# Patient Record
Sex: Female | Born: 1950 | Race: White | Hispanic: No | Marital: Married | Smoking: Never smoker
Health system: Southern US, Community
[De-identification: ages and names within clinical notes are randomized; demographics above are authoritative.]

## PROBLEM LIST (undated history)

## (undated) DIAGNOSIS — E119 Type 2 diabetes mellitus without complications: Secondary | ICD-10-CM

## (undated) DIAGNOSIS — T8859XA Other complications of anesthesia, initial encounter: Secondary | ICD-10-CM

## (undated) DIAGNOSIS — G629 Polyneuropathy, unspecified: Secondary | ICD-10-CM

## (undated) DIAGNOSIS — R55 Syncope and collapse: Secondary | ICD-10-CM

## (undated) DIAGNOSIS — Z8489 Family history of other specified conditions: Secondary | ICD-10-CM

## (undated) DIAGNOSIS — D869 Sarcoidosis, unspecified: Secondary | ICD-10-CM

## (undated) DIAGNOSIS — I2510 Atherosclerotic heart disease of native coronary artery without angina pectoris: Secondary | ICD-10-CM

## (undated) HISTORY — PX: COLONOSCOPY: SHX174

## (undated) HISTORY — PX: CATARACT EXTRACTION: SUR2

## (undated) HISTORY — PX: CARDIAC CATHETERIZATION: SHX172

## (undated) HISTORY — DX: Sarcoidosis, unspecified: D86.9

---

## 1996-12-18 HISTORY — PX: HYSTERECTOMY ABDOMINAL WITH SALPINGECTOMY: SHX6725

## 2008-12-18 HISTORY — PX: SHOULDER ARTHROSCOPY W/ ROTATOR CUFF REPAIR: SHX2400

## 2010-07-09 ENCOUNTER — Emergency Department (HOSPITAL_COMMUNITY): Admission: EM | Admit: 2010-07-09 | Discharge: 2010-07-10 | Payer: Self-pay | Admitting: Emergency Medicine

## 2011-03-04 LAB — DIFFERENTIAL
Basophils Relative: 1 % (ref 0–1)
Eosinophils Absolute: 0.2 10*3/uL (ref 0.0–0.7)
Lymphocytes Relative: 21 % (ref 12–46)
Lymphs Abs: 1.5 10*3/uL (ref 0.7–4.0)
Neutro Abs: 4.9 10*3/uL (ref 1.7–7.7)
Neutrophils Relative %: 69 % (ref 43–77)

## 2011-03-04 LAB — COMPREHENSIVE METABOLIC PANEL
Chloride: 103 mEq/L (ref 96–112)
GFR calc Af Amer: >60 mL/min (ref >60)
GFR calc non Af Amer: >60 mL/min (ref >60)
Potassium: 4.3 mEq/L (ref 3.5–5.1)
Total Bilirubin: 0.5 mg/dL (ref 0.3–1.2)

## 2011-03-04 LAB — URINALYSIS, ROUTINE W REFLEX MICROSCOPIC
Hgb urine dipstick: NEGATIVE
Nitrite: NEGATIVE
Specific Gravity, Urine: 1.022 (ref 1.005–1.030)
Urobilinogen, UA: 0.2 mg/dL (ref 0.0–1.0)

## 2011-03-04 LAB — POCT CARDIAC MARKERS
CKMB, poc: 1.5 ng/mL (ref 1.0–8.0)
Myoglobin, poc: 76 ng/mL (ref 12–200)
Troponin i, poc: <0.05 ng/mL (ref 0.00–0.09)
Troponin i, poc: <0.05 ng/mL (ref 0.00–0.09)

## 2011-03-04 LAB — CBC
HCT: 36.3 % (ref 36.0–46.0)
Hemoglobin: 12.4 g/dL (ref 12.0–15.0)
MCH: 30.6 pg (ref 26.0–34.0)
MCHC: 34.3 g/dL (ref 30.0–36.0)
Platelets: 231 10*3/uL (ref 150–400)
RDW: 14.7 % (ref 11.5–15.5)

## 2011-03-04 LAB — LIPASE, BLOOD: Lipase: 83 U/L — ABNORMAL HIGH (ref 11–59)

## 2011-03-04 LAB — URINE MICROSCOPIC-ADD ON

## 2014-05-01 ENCOUNTER — Ambulatory Visit: Payer: Self-pay | Admitting: Podiatrist

## 2014-10-16 ENCOUNTER — Other Ambulatory Visit: Payer: Self-pay | Admitting: Physician Assistant

## 2014-10-16 DIAGNOSIS — Z1231 Encounter for screening mammogram for malignant neoplasm of breast: Secondary | ICD-10-CM

## 2014-10-30 ENCOUNTER — Ambulatory Visit: Payer: Self-pay

## 2014-11-17 ENCOUNTER — Ambulatory Visit: Payer: Self-pay

## 2015-01-22 ENCOUNTER — Other Ambulatory Visit: Payer: Self-pay | Admitting: Physician Assistant

## 2015-01-22 DIAGNOSIS — Z780 Asymptomatic menopausal state: Secondary | ICD-10-CM

## 2015-05-24 ENCOUNTER — Other Ambulatory Visit: Payer: Self-pay | Admitting: Physician Assistant

## 2015-05-24 DIAGNOSIS — Z780 Asymptomatic menopausal state: Secondary | ICD-10-CM

## 2015-05-24 DIAGNOSIS — Z1231 Encounter for screening mammogram for malignant neoplasm of breast: Secondary | ICD-10-CM

## 2015-06-17 ENCOUNTER — Other Ambulatory Visit: Payer: Self-pay

## 2015-06-17 ENCOUNTER — Ambulatory Visit: Payer: Self-pay

## 2015-06-29 ENCOUNTER — Ambulatory Visit
Admission: RE | Admit: 2015-06-29 | Discharge: 2015-06-29 | Disposition: A | Discharge status: Home / Self Care | Payer: 59 | Source: Ambulatory Visit | Attending: Physician Assistant | Admitting: Physician Assistant

## 2015-06-29 DIAGNOSIS — Z1231 Encounter for screening mammogram for malignant neoplasm of breast: Secondary | ICD-10-CM

## 2015-06-29 DIAGNOSIS — Z780 Asymptomatic menopausal state: Secondary | ICD-10-CM

## 2015-07-05 ENCOUNTER — Other Ambulatory Visit: Payer: Self-pay | Admitting: Physician Assistant

## 2015-07-05 DIAGNOSIS — R928 Other abnormal and inconclusive findings on diagnostic imaging of breast: Secondary | ICD-10-CM

## 2015-07-09 ENCOUNTER — Ambulatory Visit
Admission: RE | Admit: 2015-07-09 | Discharge: 2015-07-09 | Disposition: A | Discharge status: Home / Self Care | Payer: 59 | Source: Ambulatory Visit | Attending: Physician Assistant | Admitting: Physician Assistant

## 2015-07-09 DIAGNOSIS — R928 Other abnormal and inconclusive findings on diagnostic imaging of breast: Secondary | ICD-10-CM

## 2016-02-01 ENCOUNTER — Emergency Department (HOSPITAL_COMMUNITY): Payer: Self-pay

## 2016-02-01 ENCOUNTER — Inpatient Hospital Stay (HOSPITAL_COMMUNITY)
Admission: EM | Admit: 2016-02-01 | Discharge: 2016-02-05 | DRG: 871 | Disposition: A | Discharge status: Home / Self Care | Payer: Self-pay | Attending: Internal Medicine | Admitting: Internal Medicine

## 2016-02-01 ENCOUNTER — Encounter (HOSPITAL_COMMUNITY): Payer: Self-pay | Admitting: Emergency Medicine

## 2016-02-01 DIAGNOSIS — B9789 Other viral agents as the cause of diseases classified elsewhere: Secondary | ICD-10-CM | POA: Diagnosis present

## 2016-02-01 DIAGNOSIS — A419 Sepsis, unspecified organism: Principal | ICD-10-CM | POA: Diagnosis present

## 2016-02-01 DIAGNOSIS — Z7982 Long term (current) use of aspirin: Secondary | ICD-10-CM

## 2016-02-01 DIAGNOSIS — E876 Hypokalemia: Secondary | ICD-10-CM | POA: Diagnosis not present

## 2016-02-01 DIAGNOSIS — Z6834 Body mass index (BMI) 34.0-34.9, adult: Secondary | ICD-10-CM

## 2016-02-01 DIAGNOSIS — I959 Hypotension, unspecified: Secondary | ICD-10-CM | POA: Diagnosis present

## 2016-02-01 DIAGNOSIS — E669 Obesity, unspecified: Secondary | ICD-10-CM | POA: Diagnosis present

## 2016-02-01 DIAGNOSIS — E119 Type 2 diabetes mellitus without complications: Secondary | ICD-10-CM

## 2016-02-01 DIAGNOSIS — M79662 Pain in left lower leg: Secondary | ICD-10-CM

## 2016-02-01 DIAGNOSIS — E1100 Type 2 diabetes mellitus with hyperosmolarity without nonketotic hyperglycemic-hyperosmolar coma (NKHHC): Secondary | ICD-10-CM | POA: Diagnosis present

## 2016-02-01 DIAGNOSIS — E785 Hyperlipidemia, unspecified: Secondary | ICD-10-CM | POA: Diagnosis present

## 2016-02-01 DIAGNOSIS — R000 Tachycardia, unspecified: Secondary | ICD-10-CM | POA: Diagnosis present

## 2016-02-01 DIAGNOSIS — R51 Headache: Secondary | ICD-10-CM | POA: Diagnosis present

## 2016-02-01 DIAGNOSIS — M7989 Other specified soft tissue disorders: Secondary | ICD-10-CM

## 2016-02-01 DIAGNOSIS — R6521 Severe sepsis with septic shock: Secondary | ICD-10-CM | POA: Diagnosis present

## 2016-02-01 DIAGNOSIS — I10 Essential (primary) hypertension: Secondary | ICD-10-CM | POA: Diagnosis present

## 2016-02-01 DIAGNOSIS — I82402 Acute embolism and thrombosis of unspecified deep veins of left lower extremity: Secondary | ICD-10-CM

## 2016-02-01 DIAGNOSIS — E1165 Type 2 diabetes mellitus with hyperglycemia: Secondary | ICD-10-CM | POA: Diagnosis present

## 2016-02-01 DIAGNOSIS — L03116 Cellulitis of left lower limb: Secondary | ICD-10-CM | POA: Diagnosis present

## 2016-02-01 HISTORY — DX: Type 2 diabetes mellitus without complications: E11.9

## 2016-02-01 LAB — COMPREHENSIVE METABOLIC PANEL
ALT: 29 U/L (ref 14–54)
ANION GAP: 13 (ref 5–15)
AST: 24 U/L (ref 15–41)
Albumin: 4 g/dL (ref 3.5–5.0)
Alkaline Phosphatase: 69 U/L (ref 38–126)
BUN: 19 mg/dL (ref 6–20)
CHLORIDE: 103 mmol/L (ref 101–111)
CO2: 21 mmol/L — AB (ref 22–32)
CREATININE: 0.77 mg/dL (ref 0.44–1.00)
Calcium: 9.4 mg/dL (ref 8.9–10.3)
Glucose, Bld: 151 mg/dL — ABNORMAL HIGH (ref 65–99)
POTASSIUM: 4 mmol/L (ref 3.5–5.1)
SODIUM: 137 mmol/L (ref 135–145)
Total Bilirubin: 0.8 mg/dL (ref 0.3–1.2)
Total Protein: 6.8 g/dL (ref 6.5–8.1)

## 2016-02-01 LAB — CBC WITH DIFFERENTIAL/PLATELET
BASOS ABS: 0 10*3/uL (ref 0.0–0.1)
Basophils Relative: 0 %
Eosinophils Absolute: 0.2 10*3/uL (ref 0.0–0.7)
Eosinophils Relative: 1 %
HEMATOCRIT: 39.6 % (ref 36.0–46.0)
HEMOGLOBIN: 13.4 g/dL (ref 12.0–15.0)
LYMPHS ABS: 0.9 10*3/uL (ref 0.7–4.0)
LYMPHS PCT: 6 %
MCH: 29.2 pg (ref 26.0–34.0)
MCHC: 33.8 g/dL (ref 30.0–36.0)
MCV: 86.3 fL (ref 78.0–100.0)
Monocytes Absolute: 0.8 10*3/uL (ref 0.1–1.0)
Monocytes Relative: 5 %
NEUTROS ABS: 12.7 10*3/uL — AB (ref 1.7–7.7)
NEUTROS PCT: 88 %
PLATELETS: 199 10*3/uL (ref 150–400)
RBC: 4.59 MIL/uL (ref 3.87–5.11)
RDW: 13.4 % (ref 11.5–15.5)
WBC: 14.7 10*3/uL — AB (ref 4.0–10.5)

## 2016-02-01 LAB — I-STAT CG4 LACTIC ACID, ED
LACTIC ACID, VENOUS: 1.87 mmol/L (ref 0.5–2.0)
Lactic Acid, Venous: 1.21 mmol/L (ref 0.5–2.0)

## 2016-02-01 LAB — CBG MONITORING, ED: GLUCOSE-CAPILLARY: 145 mg/dL — AB (ref 65–99)

## 2016-02-01 MED ORDER — VANCOMYCIN HCL IN DEXTROSE 1-5 GM/200ML-% IV SOLN
1000.0000 mg | Freq: Two times a day (BID) | INTRAVENOUS | Status: DC
  Administered 2016-02-02 – 2016-02-04 (×5): 1000 mg via INTRAVENOUS
  Filled 2016-02-01 (×7): qty 200

## 2016-02-01 MED ORDER — VANCOMYCIN HCL 10 G IV SOLR
1500.0000 mg | Freq: Once | INTRAVENOUS | Status: AC
  Administered 2016-02-01: 1500 mg via INTRAVENOUS
  Filled 2016-02-01: qty 1500

## 2016-02-01 MED ORDER — PIPERACILLIN-TAZOBACTAM 3.375 G IVPB
3.3750 g | Freq: Three times a day (TID) | INTRAVENOUS | Status: DC
  Administered 2016-02-02 – 2016-02-04 (×8): 3.375 g via INTRAVENOUS
  Filled 2016-02-01 (×11): qty 50

## 2016-02-01 MED ORDER — SODIUM CHLORIDE 0.9 % IV BOLUS (SEPSIS)
500.0000 mL | INTRAVENOUS | Status: AC
  Administered 2016-02-01: 500 mL via INTRAVENOUS

## 2016-02-01 MED ORDER — OSELTAMIVIR PHOSPHATE 75 MG PO CAPS
75.0000 mg | ORAL_CAPSULE | Freq: Once | ORAL | Status: AC
  Administered 2016-02-01: 75 mg via ORAL
  Filled 2016-02-01: qty 1

## 2016-02-01 MED ORDER — SODIUM CHLORIDE 0.9 % IV BOLUS (SEPSIS)
1000.0000 mL | INTRAVENOUS | Status: AC
  Administered 2016-02-01 (×2): 1000 mL via INTRAVENOUS

## 2016-02-01 MED ORDER — PIPERACILLIN-TAZOBACTAM 3.375 G IVPB 30 MIN
3.3750 g | Freq: Once | INTRAVENOUS | Status: AC
  Administered 2016-02-01: 3.375 g via INTRAVENOUS
  Filled 2016-02-01: qty 50

## 2016-02-01 MED ORDER — SODIUM CHLORIDE 0.9 % IV SOLN
Freq: Once | INTRAVENOUS | Status: AC
  Administered 2016-02-01: via INTRAVENOUS

## 2016-02-01 MED ORDER — ACETAMINOPHEN 325 MG PO TABS
650.0000 mg | ORAL_TABLET | ORAL | Status: DC | PRN
  Administered 2016-02-01 – 2016-02-02 (×2): 650 mg via ORAL
  Filled 2016-02-01: qty 2

## 2016-02-01 MED ORDER — VANCOMYCIN HCL IN DEXTROSE 1-5 GM/200ML-% IV SOLN: 1000.0000 mg | Freq: Once | INTRAVENOUS | Status: DC

## 2016-02-01 NOTE — ED Notes (Signed)
Dr. Gardner at bedside 

## 2016-02-01 NOTE — ED Notes (Signed)
Pt has some redness now present on her left shin, this was not present when the pt first arrived in her room.

## 2016-02-01 NOTE — ED Notes (Signed)
CODE SEPSIS ACTIVATED @ 2024

## 2016-02-01 NOTE — ED Notes (Signed)
Dr. Ranae Palms aware of pts blood pressure.

## 2016-02-01 NOTE — ED Notes (Signed)
Fever and chills since yesterday. Malaise. Tachypneic. tachycardic

## 2016-02-01 NOTE — ED Provider Notes (Signed)
CSN: [id number]     Arrival date & time 02/01/16  1905 History   First MD Initiated Contact with Patient 02/01/16 2036     Chief Complaint  Patient presents with  . Code Sepsis     (Consider location/radiation/quality/duration/timing/severity/associated sxs/prior Treatment) HPI History presents with acute onset fever, malaise, myalgias starting around 5:30 this evening. States she was in her normal state of health prior. Denies cough or difficulty breathing. No chest pain. Patient states she does have a frontal headache but has no focal weakness or numbness. No neck stiffness or pain. Denies vomiting but does have nausea. Has chronic history of vertigo and is experiencing room spinning sensation. Past Medical History  Diagnosis Date  . Diabetes mellitus without complication (HCC)    No past surgical history on file. History reviewed. No pertinent family history. Social History  Substance Use Topics  . Smoking status: None  . Smokeless tobacco: None  . Alcohol Use: None   OB History    No data available     Review of Systems  Constitutional: Positive for chills and fatigue.  HENT: Negative for congestion, sinus pressure and sore throat.   Respiratory: Negative for cough and shortness of breath.   Cardiovascular: Negative for chest pain.  Gastrointestinal: Positive for nausea. Negative for vomiting, abdominal pain and diarrhea.  Genitourinary: Negative for dysuria, frequency and flank pain.  Musculoskeletal: Positive for myalgias. Negative for back pain, neck pain and neck stiffness.  Neurological: Positive for dizziness, weakness (generalized) and headaches. Negative for light-headedness and numbness.  All other systems reviewed and are negative.     Allergies  Vicodin  Home Medications   Prior to Admission medications   Medication Sig Start Date End Date Taking? Authorizing Provider  aspirin 325 MG tablet Take 325 mg by mouth daily.   Yes Historical Provider, MD   canagliflozin (INVOKANA) 300 MG TABS tablet Take 300 mg by mouth daily. 06/25/15  Yes Historical Provider, MD  glipiZIDE (GLUCOTROL XL) 10 MG 24 hr tablet Take 10 mg by mouth daily. 11/25/15 11/24/16 Yes Historical Provider, MD  lisinopril-hydrochlorothiazide (PRINZIDE,ZESTORETIC) 10-12.5 MG tablet Take 1 tablet by mouth daily. 09/09/15  Yes Historical Provider, MD  metFORMIN (GLUCOPHAGE) 1000 MG tablet Take 1,000 mg by mouth 2 (two) times daily. 09/09/15  Yes Historical Provider, MD  Potassium 75 MG TABS Take 75 mg by mouth daily.   Yes Historical Provider, MD  simvastatin (ZOCOR) 40 MG tablet Take 40 mg by mouth at bedtime. 10/13/15  Yes Historical Provider, MD  zolpidem (AMBIEN) 10 MG tablet Take 10 mg by mouth at bedtime as needed.  10/19/15  Yes Historical Provider, MD   BP 93/47 mmHg  Pulse 66  Temp(Src) 98.9 F (37.2 C) (Oral)  Resp 19  Ht 5\' 1"  (1.549 m)  Wt 182 lb 8.7 oz (82.8 kg)  BMI 34.51 kg/m2  SpO2 90% Physical Exam  Constitutional: She is oriented to person, place, and time. She appears well-developed and well-nourished. She appears distressed.  HENT:  Head: Normocephalic and atraumatic.  Mouth/Throat: Oropharynx is clear and moist. No oropharyngeal exudate.  No sinus tenderness with percussion.  Eyes: EOM are normal. Pupils are equal, round, and reactive to light.  Fatigable rotary nystagmus  Neck: Normal range of motion. Neck supple.  No meningismus  Cardiovascular: Regular rhythm.  Exam reveals no gallop and no friction rub.   No murmur heard. Tachycardia  Pulmonary/Chest: Effort normal and breath sounds normal. No respiratory distress. She has no wheezes. She has  no rales. She exhibits no tenderness.  Abdominal: Soft. Bowel sounds are normal. She exhibits no distension and no mass. There is no tenderness. There is no rebound and no guarding.  Musculoskeletal: Normal range of motion. She exhibits no edema or tenderness.  No lower extremity asymmetry. 2+ pitting edema  present. Patient does have left inguinal lymphadenopathy. No cellulitis visualized. No crepitance. CVA tenderness bilaterally.  Lymphadenopathy:    She has no cervical adenopathy.  Neurological: She is alert and oriented to person, place, and time.  Requiring assistance to transfer from wheelchair to bed. Appears to be moving all extremities without focal deficit. Sensation intact. Bilateral finger-to-nose is intact.  Skin: Skin is warm and dry. No rash noted. No erythema.  Psychiatric: She has a normal mood and affect. Her behavior is normal.  Nursing note and vitals reviewed.   ED Course  Procedures (including critical care time) Labs Review Labs Reviewed  COMPREHENSIVE METABOLIC PANEL - Abnormal; Notable for the following:    CO2 21 (*)    Glucose, Bld 151 (*)    All other components within normal limits  CBC WITH DIFFERENTIAL/PLATELET - Abnormal; Notable for the following:    WBC 14.7 (*)    Neutro Abs 12.7 (*)    All other components within normal limits  URINALYSIS, ROUTINE W REFLEX MICROSCOPIC (NOT AT [facility]) - Abnormal; Notable for the following:    APPearance CLOUDY (*)    Glucose, UA >1000 (*)    Hgb urine dipstick TRACE (*)    Ketones, ur 15 (*)    All other components within normal limits  URINE MICROSCOPIC-ADD ON - Abnormal; Notable for the following:    Squamous Epithelial / LPF 0-5 (*)    Crystals CA OXALATE CRYSTALS (*)    All other components within normal limits  CBC - Abnormal; Notable for the following:    WBC 18.0 (*)    Hemoglobin 11.1 (*)    HCT 33.6 (*)    All other components within normal limits  BASIC METABOLIC PANEL - Abnormal; Notable for the following:    CO2 20 (*)    Glucose, Bld 161 (*)    Calcium 7.7 (*)    All other components within normal limits  GLUCOSE, CAPILLARY - Abnormal; Notable for the following:    Glucose-Capillary 121 (*)    All other components within normal limits  GLUCOSE, CAPILLARY - Abnormal; Notable for the following:     Glucose-Capillary 121 (*)    All other components within normal limits  GLUCOSE, CAPILLARY - Abnormal; Notable for the following:    Glucose-Capillary 102 (*)    All other components within normal limits  CBG MONITORING, ED - Abnormal; Notable for the following:    Glucose-Capillary 145 (*)    All other components within normal limits  CBG MONITORING, ED - Abnormal; Notable for the following:    Glucose-Capillary 144 (*)    All other components within normal limits  CULTURE, BLOOD (ROUTINE X 2)  CULTURE, BLOOD (ROUTINE X 2)  MRSA PCR SCREENING  URINE CULTURE  PROCALCITONIN  GLUCOSE, CAPILLARY  GLUCOSE, CAPILLARY  COMPREHENSIVE METABOLIC PANEL  CBC  PROTIME-INR  APTT  I-STAT CG4 LACTIC ACID, ED  I-STAT CG4 LACTIC ACID, ED  I-STAT CG4 LACTIC ACID, ED    Imaging Review Dg Chest 2 View  02/01/2016  CLINICAL DATA:  65 year old female with fever and chills EXAM: CHEST  2 VIEW COMPARISON:  None. FINDINGS: Two views of the chest do not demonstrate a  focal consolidation. There is no pleural effusion or pneumothorax. The lungs are hypovolemic. The cardiac silhouette is within normal limits with the osseous structures appear unremarkable. IMPRESSION: No active cardiopulmonary disease. Electronically Signed   By: Elgie Collard M.D.   On: 02/01/2016 20:47   Ct Tibia Fibula Left W Contrast  02/02/2016  CLINICAL DATA:  Fever and chills since 01/31/2016. Pain, swelling, and redness to the left lower extremity. EXAM: CT OF THE LOWER LEFT EXTREMITY WITH CONTRAST TECHNIQUE: Multidetector CT imaging of the left lower leg was performed according to the standard protocol following intravenous contrast administration. COMPARISON:  None. CONTRAST:  75 mL Omnipaque 300 FINDINGS: Diffuse infiltration throughout the subcutaneous fat of the left lower leg most prominent along the medial surface. This is likely due to edema or cellulitis. No discrete loculated fluid collections are identified suggesting no  discrete abscess. Anterior and posterior muscle compartments appear intact. The left tibia and fibula appear intact. No acute fractures are demonstrated. No bone erosion, cortical thickening, or bone sclerosis. No changes to suggest osteomyelitis. MRI would be more sensitive for evaluation of bone and soft tissue structures if clinically indicated. IMPRESSION: Diffuse edema versus cellulitis throughout the right lower leg medially. No discrete abscess identified. No changes to suggest osteomyelitis. Electronically Signed   By: Burman Nieves M.D.   On: 02/02/2016 06:20   I have personally reviewed and evaluated these images and lab results as part of my medical decision-making.   EKG Interpretation   Date/Time:  Tuesday February 01 2016 21:30:24 EST Ventricular Rate:  107 PR Interval:  150 QRS Duration: 91 QT Interval:  329 QTC Calculation: 439 R Axis:   99 Text Interpretation:  Sinus tachycardia Right axis deviation Low voltage,  precordial leads ED PHYSICIAN INTERPRETATION AVAILABLE IN [facility]  Confirmed by TEST, Record ([id number]) on 02/02/2016 8:03:09 AM      MDM   Final diagnoses:  Sepsis, due to unspecified organism (HCC)  Cellulitis of left lower extremity    Code sepsis called based on vital signs. Patient has a normal lactic acid with elevation in her white blood cell count. Unknown source. Will start on antibiotics and IV fluids  She has developed left lower extremity redness during her stay. Consistent with cellulitis. Has been given IV fluids and broad-spectrum antibiotics. Will discuss with hospitalist and admitted.  Loren Racer, MD 02/02/16 2259

## 2016-02-01 NOTE — Consult Note (Addendum)
Pharmacy Antibiotic Note  Felicia Mills is a 65 y.o. female admitted on 02/01/2016 with sepsis.  Pharmacy has been consulted for vanc/zosyn dosing.   Sepsis due to known source. Pt febrile to 103.3 w/ WBC elevated, tachycardia, and tachypnea.   Plan: Vanc 1500 mg IV x1 Zosyn 3.375 g IV x1 F/u renal function  Height:  (154.9 cm) Weight: 162 lb (73.483 kg) IBW/kg (Calculated) : 47.8  Temp (24hrs), Avg:103.3 F (39.6 C), Min:103.3 F (39.6 C), Max:103.3 F (39.6 C)   Recent Labs Lab 02/01/16 2033 02/01/16 2036  WBC 14.7*  --   LATICACIDVEN  --  1.87    CrCl cannot be calculated (Patient has no serum creatinine result on file.).    Allergies  Allergen Reactions  . Vicodin [Hydrocodone-Acetaminophen] Nausea And Vomiting    Antimicrobials this admission: Vanc 2/14 >>  Zosyn 2/14 >>   Dose adjustments this admission: n/a  Microbiology results: 2/14 BCx:   Thank you for allowing pharmacy to be a part of this patient's care.  Greggory Stallion, PharmD Clinical Pharmacy Resident Pager # [phone] 02/01/2016 9:45 PM    Pharmacy Code Sepsis Protocol  Time of code sepsis page: 20:27  Antibiotics delivered at 21:03  Were antibiotics ordered at the time of the code sepsis page? No Was it required to contact the physician?  Physician not contacted  Physician contacted to order antibiotics for code sepsis  Physician contacted to recommend changing antibiotics  Pharmacy consulted for: vanc/zosyn  Anti-infectives    Start     Dose/Rate Route Frequency Ordered Stop   02/01/16 2100  piperacillin-tazobactam (ZOSYN) IVPB 3.375 g     3.375 g 100 mL/hr over 30 Minutes Intravenous  Once 02/01/16 2056     02/01/16 2100  vancomycin (VANCOCIN) IVPB 1000 mg/200 mL premix  Status:  Discontinued     1,000 mg 200 mL/hr over 60 Minutes Intravenous  Once 02/01/16 2056 02/01/16 2057   02/01/16 2100  oseltamivir (TAMIFLU) capsule 75 mg     75 mg Oral  Once 02/01/16 2057      02/01/16 2100  vancomycin (VANCOCIN) 1,500 mg in sodium chloride 0.9 % 500 mL IVPB     1,500 mg 250 mL/hr over 120 Minutes Intravenous  Once 02/01/16 2057          Nurse education provided:  Minutes left to administer antibiotics to achieve 1 hour goal  Correct order of antibiotic administration  Antibiotic Y-site compatibilities     Annamary Carolin, PharmD 02/01/2016, 9:04 PM  Addendum: Good renal so will continue zosyn 3.375gm IV Q8H (4 hr inf) and vancomycin 1gm IV Q12H  Lysle Pearl, PharmD, BCPS Pager # [phone] 02/01/2016 9:55 PM

## 2016-02-02 ENCOUNTER — Encounter (HOSPITAL_COMMUNITY): Payer: Self-pay

## 2016-02-02 ENCOUNTER — Inpatient Hospital Stay (HOSPITAL_COMMUNITY): Payer: Self-pay

## 2016-02-02 DIAGNOSIS — E119 Type 2 diabetes mellitus without complications: Secondary | ICD-10-CM

## 2016-02-02 DIAGNOSIS — L03119 Cellulitis of unspecified part of limb: Secondary | ICD-10-CM

## 2016-02-02 DIAGNOSIS — L03116 Cellulitis of left lower limb: Secondary | ICD-10-CM | POA: Diagnosis present

## 2016-02-02 DIAGNOSIS — I10 Essential (primary) hypertension: Secondary | ICD-10-CM | POA: Diagnosis present

## 2016-02-02 DIAGNOSIS — L02419 Cutaneous abscess of limb, unspecified: Secondary | ICD-10-CM

## 2016-02-02 DIAGNOSIS — I959 Hypotension, unspecified: Secondary | ICD-10-CM

## 2016-02-02 DIAGNOSIS — A419 Sepsis, unspecified organism: Secondary | ICD-10-CM | POA: Diagnosis present

## 2016-02-02 LAB — URINE MICROSCOPIC-ADD ON: Bacteria, UA: NONE SEEN

## 2016-02-02 LAB — CBC
HEMATOCRIT: 33.6 % — AB (ref 36.0–46.0)
HEMOGLOBIN: 11.1 g/dL — AB (ref 12.0–15.0)
MCH: 28.6 pg (ref 26.0–34.0)
MCHC: 33 g/dL (ref 30.0–36.0)
MCV: 86.6 fL (ref 78.0–100.0)
Platelets: 167 10*3/uL (ref 150–400)
RBC: 3.88 MIL/uL (ref 3.87–5.11)
RDW: 13.6 % (ref 11.5–15.5)
WBC: 18 10*3/uL — ABNORMAL HIGH (ref 4.0–10.5)

## 2016-02-02 LAB — BASIC METABOLIC PANEL
Anion gap: 8 (ref 5–15)
BUN: 15 mg/dL (ref 6–20)
CHLORIDE: 109 mmol/L (ref 101–111)
CO2: 20 mmol/L — AB (ref 22–32)
CREATININE: 0.79 mg/dL (ref 0.44–1.00)
Calcium: 7.7 mg/dL — ABNORMAL LOW (ref 8.9–10.3)
GFR calc Af Amer: >60 mL/min (ref >60)
GFR calc non Af Amer: >60 mL/min (ref >60)
Glucose, Bld: 161 mg/dL — ABNORMAL HIGH (ref 65–99)
Potassium: 3.7 mmol/L (ref 3.5–5.1)
Sodium: 137 mmol/L (ref 135–145)

## 2016-02-02 LAB — GLUCOSE, CAPILLARY
GLUCOSE-CAPILLARY: 121 mg/dL — AB (ref 65–99)
GLUCOSE-CAPILLARY: 99 mg/dL (ref 65–99)
GLUCOSE-CAPILLARY: 84 mg/dL (ref 65–99)
Glucose-Capillary: 121 mg/dL — ABNORMAL HIGH (ref 65–99)
Glucose-Capillary: 102 mg/dL — ABNORMAL HIGH (ref 65–99)

## 2016-02-02 LAB — MRSA PCR SCREENING: MRSA BY PCR: NEGATIVE

## 2016-02-02 LAB — URINALYSIS, ROUTINE W REFLEX MICROSCOPIC
BILIRUBIN URINE: NEGATIVE
Glucose, UA: >1000 mg/dL — AB
KETONES UR: 15 mg/dL — AB
Leukocytes, UA: NEGATIVE
NITRITE: NEGATIVE
PROTEIN: NEGATIVE mg/dL
SPECIFIC GRAVITY, URINE: 1.03 (ref 1.005–1.030)
pH: 5 (ref 5.0–8.0)

## 2016-02-02 LAB — CBG MONITORING, ED: Glucose-Capillary: 144 mg/dL — ABNORMAL HIGH (ref 65–99)

## 2016-02-02 LAB — I-STAT CG4 LACTIC ACID, ED: Lactic Acid, Venous: 0.97 mmol/L (ref 0.5–2.0)

## 2016-02-02 LAB — PROCALCITONIN: PROCALCITONIN: 2.68 ng/mL

## 2016-02-02 MED ORDER — ZOLPIDEM TARTRATE 5 MG PO TABS
5.0000 mg | ORAL_TABLET | Freq: Every evening | ORAL | Status: DC | PRN
  Administered 2016-02-02 – 2016-02-04 (×3): 5 mg via ORAL
  Filled 2016-02-02 (×3): qty 1

## 2016-02-02 MED ORDER — SODIUM CHLORIDE 0.9 % IV SOLN
Freq: Once | INTRAVENOUS | Status: AC
  Administered 2016-02-02: 04:00:00 via INTRAVENOUS

## 2016-02-02 MED ORDER — SODIUM CHLORIDE 0.9 % IV SOLN
INTRAVENOUS | Status: DC
  Administered 2016-02-02 – 2016-02-03 (×4): via INTRAVENOUS

## 2016-02-02 MED ORDER — ACETAMINOPHEN 325 MG PO TABS
650.0000 mg | ORAL_TABLET | Freq: Four times a day (QID) | ORAL | Status: DC | PRN
  Filled 2016-02-02: qty 2

## 2016-02-02 MED ORDER — OXYCODONE HCL 5 MG PO TABS
5.0000 mg | ORAL_TABLET | ORAL | Status: DC | PRN
  Administered 2016-02-02 – 2016-02-04 (×3): 10 mg via ORAL
  Filled 2016-02-02 (×3): qty 2

## 2016-02-02 MED ORDER — TRAMADOL HCL 50 MG PO TABS
50.0000 mg | ORAL_TABLET | Freq: Four times a day (QID) | ORAL | Status: DC | PRN
Start: 2016-02-02 — End: 2016-02-05
  Administered 2016-02-03 (×2): 50 mg via ORAL
  Filled 2016-02-02 (×2): qty 1

## 2016-02-02 MED ORDER — SODIUM CHLORIDE 0.9% FLUSH
3.0000 mL | Freq: Two times a day (BID) | INTRAVENOUS | Status: DC
  Administered 2016-02-02 – 2016-02-03 (×2): 3 mL via INTRAVENOUS

## 2016-02-02 MED ORDER — ASPIRIN 325 MG PO TABS
325.0000 mg | ORAL_TABLET | Freq: Every day | ORAL | Status: DC
  Administered 2016-02-02 – 2016-02-05 (×4): 325 mg via ORAL
  Filled 2016-02-02 (×5): qty 1

## 2016-02-02 MED ORDER — INFLUENZA VAC SPLIT QUAD 0.5 ML IM SUSY
0.5000 mL | PREFILLED_SYRINGE | INTRAMUSCULAR | Status: AC
  Administered 2016-02-03: 0.5 mL via INTRAMUSCULAR
  Filled 2016-02-02: qty 0.5

## 2016-02-02 MED ORDER — ZOLPIDEM TARTRATE 5 MG PO TABS
10.0000 mg | ORAL_TABLET | Freq: Every evening | ORAL | Status: DC | PRN
  Administered 2016-02-02: 10 mg via ORAL
  Filled 2016-02-02: qty 2

## 2016-02-02 MED ORDER — ACETAMINOPHEN 325 MG PO TABS
650.0000 mg | ORAL_TABLET | ORAL | Status: DC | PRN
  Administered 2016-02-02: 650 mg via ORAL
  Filled 2016-02-02: qty 2

## 2016-02-02 MED ORDER — SIMVASTATIN 40 MG PO TABS
40.0000 mg | ORAL_TABLET | Freq: Every day | ORAL | Status: DC
  Administered 2016-02-02 – 2016-02-04 (×4): 40 mg via ORAL
  Filled 2016-02-02 (×5): qty 1

## 2016-02-02 MED ORDER — ENOXAPARIN SODIUM 40 MG/0.4ML ~~LOC~~ SOLN
40.0000 mg | Freq: Every day | SUBCUTANEOUS | Status: DC
  Administered 2016-02-02 – 2016-02-05 (×4): 40 mg via SUBCUTANEOUS
  Filled 2016-02-02 (×5): qty 0.4

## 2016-02-02 MED ORDER — PNEUMOCOCCAL VAC POLYVALENT 25 MCG/0.5ML IJ INJ
0.5000 mL | INJECTION | INTRAMUSCULAR | Status: AC
  Administered 2016-02-03: 0.5 mL via INTRAMUSCULAR
  Filled 2016-02-02: qty 0.5

## 2016-02-02 MED ORDER — ACETAMINOPHEN 325 MG PO TABS: 650.0000 mg | ORAL_TABLET | ORAL | Status: DC | PRN

## 2016-02-02 MED ORDER — INSULIN ASPART 100 UNIT/ML ~~LOC~~ SOLN
0.0000 [IU] | Freq: Three times a day (TID) | SUBCUTANEOUS | Status: DC
  Administered 2016-02-03: 2 [IU] via SUBCUTANEOUS

## 2016-02-02 MED ORDER — SODIUM CHLORIDE 0.9 % IV SOLN
Freq: Once | INTRAVENOUS | Status: AC
  Administered 2016-02-02: 05:00:00 via INTRAVENOUS

## 2016-02-02 MED ORDER — SODIUM CHLORIDE 0.9 % IV SOLN
Freq: Once | INTRAVENOUS | Status: AC
  Administered 2016-02-02: 06:00:00 via INTRAVENOUS

## 2016-02-02 MED ORDER — IOHEXOL 300 MG/ML  SOLN
75.0000 mL | Freq: Once | INTRAMUSCULAR | Status: AC | PRN
Start: 2016-02-02 — End: 2016-02-02
  Administered 2016-02-02: 75 mL via INTRAVENOUS

## 2016-02-02 NOTE — Care Management Note (Signed)
Case Management Note  Patient Details  Name: Felicia Mills MRN: [id number] Date of Birth: Dec 11, 1951  Subjective/Objective:   Pt admitted with sepsis                 Action/Plan:  Pt is independent from home with husband.  CM will continue to monitor for disposition needs   Expected Discharge Date:                  Expected Discharge Plan:  Home/Self Care  In-House Referral:     Discharge planning Services  CM Consult  Post Acute Care Choice:    Choice offered to:     DME Arranged:    DME Agency:     HH Arranged:    HH Agency:     Status of Service:  In process, will continue to follow  Medicare Important Message Given:    Date Medicare IM Given:    Medicare IM give by:    Date Additional Medicare IM Given:    Additional Medicare Important Message give by:     If discussed at Long Length of Stay Meetings, dates discussed:    Additional Comments:  Cherylann Parr, RN 02/02/2016, 2:26 PM

## 2016-02-02 NOTE — Consult Note (Signed)
PULMONARY / CRITICAL CARE MEDICINE   Name: Felicia Mills MRN: [id number] DOB: 02-27-1951    ADMISSION DATE:  02/01/2016 CONSULTATION DATE:  02/01/16  REFERRING MD:  Maretta Bees  CHIEF COMPLAINT:  Septic shock, LE cellulitis  HISTORY OF PRESENT ILLNESS:   Felicia Mills is a 65 year old with history of type 2 diabetes mellitus.she is admitted on 2/14 with left lower extremity cellulitis, septic shock. She is admitted to hospitalist service and given 7 L of IV fluid resuscitation with still low blood pressure. PCCM consulted for help with management of shock.   PAST MEDICAL HISTORY :  She  has a past medical history of Diabetes mellitus without complication (HCC).  PAST SURGICAL HISTORY: She  has no past surgical history on file.  Allergies  Allergen Reactions  . Vicodin [Hydrocodone-Acetaminophen] Nausea And Vomiting    No current facility-administered medications on file prior to encounter.   No current outpatient prescriptions on file prior to encounter.    FAMILY HISTORY:  Her has no family status information on file.   SOCIAL HISTORY: No significant smoking history  REVIEW OF SYSTEMS:   LE swelling, pain, tenderness. Fevers, chills. No shortness of breath, wheezing, cough, sputum production. No fevers, chills, loss of weight, loss of appetite. No nausea, vomiting, diarrhea, constipation. All other review of systems is negative  SUBJECTIVE:    VITAL SIGNS: BP 82/49 mmHg  Pulse 84  Temp(Src) 99.1 F (37.3 C) (Oral)  Resp 17  Ht  (1.549 m)  Wt 182 lb 8.7 oz (82.8 kg)  BMI 34.51 kg/m2  SpO2 95%  HEMODYNAMICS:    VENTILATOR SETTINGS:    INTAKE / OUTPUT: I/O last 3 completed shifts: In: -  Out: 720 [Urine:720]  PHYSICAL EXAMINATION: General:  Awake, oriented, no distress Neuro:  No gross focal deficits HEENT:  Moist mucous membranes, no JVD, thyromegaly Cardiovascular: Regular rate and rhythm, no murmurs rubs gallops Lungs:  Clear, no wheeze,  crackles. Abdomen:  Soft, + BS Skin:  Intact  LABS:  BMET  Recent Labs Lab 02/01/16 2033 02/02/16 0310  NA 137 137  K 4.0 3.7  CL 103 109  CO2 21* 20*  BUN 19 15  CREATININE 0.77 0.79  GLUCOSE 151* 161*    Electrolytes  Recent Labs Lab 02/01/16 2033 02/02/16 0310  CALCIUM 9.4 7.7*    CBC  Recent Labs Lab 02/01/16 2033 02/02/16 0310  WBC 14.7* 18.0*  HGB 13.4 11.1*  HCT 39.6 33.6*  PLT 199 167    Coag's No results for input(s): APTT, INR in the last 168 hours.  Sepsis Markers  Recent Labs Lab 02/01/16 2036 02/01/16 2320 02/02/16 0310 02/02/16 0402  LATICACIDVEN 1.87 1.21  --  0.97  PROCALCITON  --   --  2.68  --     ABG No results for input(s): PHART, PCO2ART, PO2ART in the last 168 hours.  Liver Enzymes  Recent Labs Lab 02/01/16 2033  AST 24  ALT 29  ALKPHOS 69  BILITOT 0.8  ALBUMIN 4.0    Cardiac Enzymes No results for input(s): TROPONINI, PROBNP in the last 168 hours.  Glucose  Recent Labs Lab 02/01/16 2101 02/02/16 0341  GLUCAP 145* 144*    Imaging Dg Chest 2 View  02/01/2016  CLINICAL DATA:  65 year old female with fever and chills EXAM: CHEST  2 VIEW COMPARISON:  None. FINDINGS: Two views of the chest do not demonstrate a focal consolidation. There is no pleural effusion or pneumothorax. The lungs are hypovolemic. The cardiac  silhouette is within normal limits with the osseous structures appear unremarkable. IMPRESSION: No active cardiopulmonary disease. Electronically Signed   By: Elgie Collard M.D.   On: 02/01/2016 20:47   Ct Tibia Fibula Left W Contrast  02/02/2016  CLINICAL DATA:  Fever and chills since 01/31/2016. Pain, swelling, and redness to the left lower extremity. EXAM: CT OF THE LOWER LEFT EXTREMITY WITH CONTRAST TECHNIQUE: Multidetector CT imaging of the left lower leg was performed according to the standard protocol following intravenous contrast administration. COMPARISON:  None. CONTRAST:  75 mL Omnipaque  300 FINDINGS: Diffuse infiltration throughout the subcutaneous fat of the left lower leg most prominent along the medial surface. This is likely due to edema or cellulitis. No discrete loculated fluid collections are identified suggesting no discrete abscess. Anterior and posterior muscle compartments appear intact. The left tibia and fibula appear intact. No acute fractures are demonstrated. No bone erosion, cortical thickening, or bone sclerosis. No changes to suggest osteomyelitis. MRI would be more sensitive for evaluation of bone and soft tissue structures if clinically indicated. IMPRESSION: Diffuse edema versus cellulitis throughout the right lower leg medially. No discrete abscess identified. No changes to suggest osteomyelitis. Electronically Signed   By: Burman Nieves M.D.   On: 02/02/2016 06:20     STUDIES:  CT scan lt LE 2/15 >> Diffuse erythema, cellulitis. No abscess or gas  CULTURES: Bcx X 2 2/14 >> Ucx 2/14 >.  ANTIBIOTICS: Vanco 2/14 >> Zosyn 2/14 >>  SIGNIFICANT EVENTS: 2/14 - Admit with cellutlitis  LINES/TUBES:  DISCUSSION: 65 Y/O with DM admitted with septic shock from Lt LE cellulitis. PCCM consulted for help with management of shock. Her BP appears to be responding now with a MAP of 70. She has normal lactate with good urine output, adequate mentation, good capillary refill. She has no evidence of end organ hypoperfusion and no need for pressors.  Reccomendations: - Continued fluids resuscitation with NS at 150cc/hr - Monitor for progression of cellulitis. Surgery consult if sepsis or leg examination worsens. - Broad spectrun anx. Follow cultures - Consider LE [place] to R/O DVT.  PCCM will sign off. Please call back as needed.  Critical care time- 35 mins.  Chilton Greathouse MD [facility] Pager [phone] If no answer or after 3pm call: [phone] 02/02/2016, 8:09 AM

## 2016-02-02 NOTE — Progress Notes (Signed)
[facility] TEAM 1 - Stepdown/ICU TEAM PROGRESS NOTE  Felicia Mills ZOX:[id number] DOB: 04-30-1951 DOA: 02/01/2016 PCP: No primary care provider on file.  Admit HPI / Brief Narrative: 65 y.o. female who presented to the ED with c/o fever, chills, mild headache, left groin and lower leg pain. Nausea but no vomiting, no diarrhea, no cough, no SOB, no chest pain. Symptom onset was sudden and severely around 5:30 PM the night of presentation. During her stay in the ED the L shin became progressively erythematous. She reported this same presentation in the past with cellulitis of the leg, most recently 3 years prior.   HPI/Subjective: Pt seen for f/u visit.  Assessment/Plan:  Sepsis due to L LE cellulitis  No exam findings or imaging findings presently to suggest fasciitis or abscess   Hypotension w/ hx of HTN  DM2  Code Status: FULL Family Communication: spoke w/ husband at bedside  Disposition Plan: SDU  Consultants: none  Procedures: none  Antibiotics: Zosyn 2/14 > Vancomycin 2/14 >  DVT prophylaxis: lovenox  Objective: Blood pressure 89/52, pulse 83, temperature 99.1 F (37.3 C), temperature source Oral, resp. rate 20, height  (1.549 m), weight 82.8 kg (182 lb 8.7 oz), SpO2 98 %.  Intake/Output Summary (Last 24 hours) at 02/02/16 0809 Last data filed at 02/02/16 0800  Gross per 24 hour  Intake    300 ml  Output   1020 ml  Net   -720 ml   Exam: Pt seen for f/u visit.  Data Reviewed: Basic Metabolic Panel:  Recent Labs Lab 02/01/16 2033 02/02/16 0310  NA 137 137  K 4.0 3.7  CL 103 109  CO2 21* 20*  GLUCOSE 151* 161*  BUN 19 15  CREATININE 0.77 0.79  CALCIUM 9.4 7.7*    CBC:  Recent Labs Lab 02/01/16 2033 02/02/16 0310  WBC 14.7* 18.0*  NEUTROABS 12.7*  --   HGB 13.4 11.1*  HCT 39.6 33.6*  MCV 86.3 86.6  PLT 199 167    Liver Function Tests:  Recent Labs Lab 02/01/16 2033  AST 24  ALT 29  ALKPHOS 69  BILITOT 0.8  PROT 6.8    ALBUMIN 4.0   No results for input(s): LIPASE, AMYLASE in the last 168 hours. No results for input(s): AMMONIA in the last 168 hours.  Coags: No results for input(s): INR in the last 168 hours.  Invalid input(s): PT No results for input(s): APTT in the last 168 hours.  Cardiac Enzymes: No results for input(s): CKTOTAL, CKMB, CKMBINDEX, TROPONINI in the last 168 hours.  CBG:  Recent Labs Lab 02/01/16 2101 02/02/16 0341  GLUCAP 145* 144*    No results found for this or any previous visit (from the past 240 hour(s)).   Studies:   Recent x-ray studies have been reviewed in detail by the Attending Physician  Scheduled Meds:  Scheduled Meds: . aspirin  325 mg Oral Daily  . enoxaparin (LOVENOX) injection  40 mg Subcutaneous Daily  . insulin aspart  0-15 Units Subcutaneous TID WC  . piperacillin-tazobactam (ZOSYN)  IV  3.375 g Intravenous Q8H  . simvastatin  40 mg Oral QHS  . sodium chloride flush  3 mL Intravenous Q12H  . vancomycin  1,000 mg Intravenous Q12H    Time spent on care of this patient: No charge   Lonia Blood , MD   [facility] Office  [phone] Pager - Text Page per Loretha Stapler as per below:  On-Call/Text Page:  [email or web address]      password [email or web address]  If 7PM-7AM, please contact night-coverage [email or web address] Password [email or web address] 02/02/2016, 8:09 AM   LOS: 0 days

## 2016-02-02 NOTE — ED Notes (Signed)
Dr. Elesa Massed advised this RN to page Dr. Toniann Fail about pt status.

## 2016-02-02 NOTE — H&P (Addendum)
[facility] History and Physical  MAVEN ROSANDER ZOX:[id number] DOB: 1950/12/26 DOA: 02/01/2016  Referring physician: EDP PCP: No primary care provider on file.   Chief Complaint: Fever / chills   HPI: Felicia Mills is a 65 y.o. female who presents to the ED with c/o fever, chills, mild headache, left groin and lower leg pain.  Nausea but no vomiting, no diarrhea, no cough, no SOB, no chest pain.  Symptoms onset suddenly and severely around 5:30 PM this evening.  She presents to the ED for evaluation.  During her stay in the ED the L shin has become progressively erythematous (suggestive of source).  She has had this presentation in the past with cellulitis of the leg she says, most recently 3 years ago.  Systemic symptoms before skin symptoms occurred at that time as well.  No pain beyond site of redness.  Review of Systems: Systems reviewed.  As above, otherwise negative  History reviewed. No pertinent past medical history. No past surgical history on file. Social History:  has no tobacco, alcohol, and drug history on file.  Allergies  Allergen Reactions  . Vicodin [Hydrocodone-Acetaminophen] Nausea And Vomiting    History reviewed. No pertinent family history.   Prior to Admission medications   Medication Sig Start Date End Date Taking? Authorizing Provider  aspirin 325 MG tablet Take 325 mg by mouth daily.   Yes Historical Provider, MD  canagliflozin (INVOKANA) 300 MG TABS tablet Take 300 mg by mouth daily. 06/25/15  Yes Historical Provider, MD  glipiZIDE (GLUCOTROL XL) 10 MG 24 hr tablet Take 10 mg by mouth daily. 11/25/15 11/24/16 Yes Historical Provider, MD  lisinopril-hydrochlorothiazide (PRINZIDE,ZESTORETIC) 10-12.5 MG tablet Take 1 tablet by mouth daily. 09/09/15  Yes Historical Provider, MD  metFORMIN (GLUCOPHAGE) 1000 MG tablet Take 1,000 mg by mouth 2 (two) times daily. 09/09/15  Yes Historical Provider, MD  Potassium 75 MG TABS Take 75 mg by mouth daily.   Yes  Historical Provider, MD  simvastatin (ZOCOR) 40 MG tablet Take 40 mg by mouth at bedtime. 10/13/15  Yes Historical Provider, MD  zolpidem (AMBIEN) 10 MG tablet Take 10 mg by mouth at bedtime as needed.  10/19/15  Yes Historical Provider, MD   Physical Exam: Filed Vitals:   02/01/16 2342 02/01/16 2345  BP:  99/50  Pulse:  106  Temp: 100.5 F (38.1 C)   Resp:  23    BP 99/50 mmHg  Pulse 106  Temp(Src) 100.5 F (38.1 C) (Oral)  Resp 23  Ht  (1.549 m)  Wt 73.483 kg (162 lb)  BMI 30.63 kg/m2  SpO2 98%  General Appearance:    Alert, oriented, no distress, appears stated age  Head:    Normocephalic, atraumatic  Eyes:    PERRL, EOMI, sclera non-icteric        Nose:   Nares without drainage or epistaxis. Mucosa, turbinates normal  Throat:   Moist mucous membranes. Oropharynx without erythema or exudate.  Neck:   Supple. No carotid bruits.  No thyromegaly.  No lymphadenopathy.   Back:     No CVA tenderness, no spinal tenderness  Lungs:     Clear to auscultation bilaterally, without wheezes, rhonchi or rales  Chest wall:    No tenderness to palpitation  Heart:    Regular rate and rhythm without murmurs, gallops, rubs  Abdomen:     Soft, non-tender, nondistended, normal bowel sounds, no organomegaly  Genitalia:    deferred  Rectal:    deferred  Extremities:   No clubbing, cyanosis or edema.  Pulses:   2+ and symmetric all extremities  Skin:   Developing mild erythema of L shin, no pain beyond site of erythema, mild tenderness when palpated (not out of proportion), no Sub Q air palpated.  Lymph nodes:   Cervical, supraclavicular, and axillary nodes normal  Neurologic:   CNII-XII intact. Normal strength, sensation and reflexes      throughout    Labs on Admission:  Basic Metabolic Panel:  Recent Labs Lab 02/01/16 2033  NA 137  K 4.0  CL 103  CO2 21*  GLUCOSE 151*  BUN 19  CREATININE 0.77  CALCIUM 9.4   Liver Function Tests:  Recent Labs Lab 02/01/16 2033  AST  24  ALT 29  ALKPHOS 69  BILITOT 0.8  PROT 6.8  ALBUMIN 4.0   No results for input(s): LIPASE, AMYLASE in the last 168 hours. No results for input(s): AMMONIA in the last 168 hours. CBC:  Recent Labs Lab 02/01/16 2033  WBC 14.7*  NEUTROABS 12.7*  HGB 13.4  HCT 39.6  MCV 86.3  PLT 199   Cardiac Enzymes: No results for input(s): CKTOTAL, CKMB, CKMBINDEX, TROPONINI in the last 168 hours.  BNP (last 3 results) No results for input(s): PROBNP in the last 8760 hours. CBG:  Recent Labs Lab 02/01/16 2101  GLUCAP 145*    Radiological Exams on Admission: Dg Chest 2 View  02/01/2016  CLINICAL DATA:  65 year old female with fever and chills EXAM: CHEST  2 VIEW COMPARISON:  None. FINDINGS: Two views of the chest do not demonstrate a focal consolidation. There is no pleural effusion or pneumothorax. The lungs are hypovolemic. The cardiac silhouette is within normal limits with the osseous structures appear unremarkable. IMPRESSION: No active cardiopulmonary disease. Electronically Signed   By: Elgie Collard M.D.   On: 02/01/2016 20:47    EKG: Independently reviewed.  Assessment/Plan Principal Problem:   Sepsis (HCC) Active Problems:   Cellulitis of left lower leg   Sepsis associated hypotension (HCC)   DM2 (diabetes mellitus, type 2) (HCC)   HTN (hypertension)   1. Sepsis - 1. Sepsis pathway 2. IVF 3. BCx 4. Zosyn / vanc 5. Tylenol PRN fever 6. Patient does not appear grossly toxic at this point, normal lactate 2. Cellulitis of left lower leg - 1. Despite rapid onset and presence of sepsis, patient does not have other worrisome signs for necrotizing faciitis, specifically no Sub Q air, no pain out of proportion to inflammation, no pain beyond site of erythema. 2. Still would keep a close eye on this and have low threshold to consult surgery if it rapidly and significantly worsens or she develops any of the above signs. 3. DM2 - 1. Holding home meds 2. SSI AC  moderate dose 4. HTN - holding lisinopril-HCTZ due to hypotension in ED    Code Status: Full  Family Communication: Family at bedside Disposition Plan: Admit to inpatient   Time spent: 70 min  Kempton Milne M. [facility] Pager [phone]  If 7AM-7PM, please contact the day team taking care of the patient [email or web address] Password [email or web address] 02/02/2016, 12:06 AM

## 2016-02-02 NOTE — ED Notes (Signed)
Pt placed on bedpan

## 2016-02-02 NOTE — ED Notes (Signed)
Pt has started shivering again. Pt feels hot to the touch.

## 2016-02-02 NOTE — ED Notes (Signed)
Attempting to page admitting provider for the third time.

## 2016-02-02 NOTE — Progress Notes (Signed)
UR Completed. Merica Prell, RN, BSN.  [phone] 

## 2016-02-02 NOTE — ED Notes (Signed)
Dr. Kakrakandy at bedside. 

## 2016-02-02 NOTE — ED Notes (Signed)
The area of redness on the pts left shin has increased in size. The area is hot to the touch. The left calf is swollen.

## 2016-02-02 NOTE — ED Notes (Signed)
Dr. Toniann Fail notified about pts blood pressure and procalcitonin level.

## 2016-02-02 NOTE — ED Notes (Signed)
Message sent to pharmacy about lovenox and zocor.

## 2016-02-02 NOTE — ED Notes (Signed)
Patient transported to CT 

## 2016-02-02 NOTE — ED Notes (Signed)
Dr. Toniann Fail updated about lactic result.

## 2016-02-03 ENCOUNTER — Encounter (HOSPITAL_COMMUNITY): Payer: Self-pay | Admitting: Emergency Medicine

## 2016-02-03 DIAGNOSIS — I9589 Other hypotension: Secondary | ICD-10-CM

## 2016-02-03 DIAGNOSIS — E1100 Type 2 diabetes mellitus with hyperosmolarity without nonketotic hyperglycemic-hyperosmolar coma (NKHHC): Secondary | ICD-10-CM | POA: Diagnosis present

## 2016-02-03 DIAGNOSIS — I959 Hypotension, unspecified: Secondary | ICD-10-CM | POA: Diagnosis present

## 2016-02-03 DIAGNOSIS — A419 Sepsis, unspecified organism: Secondary | ICD-10-CM | POA: Diagnosis present

## 2016-02-03 LAB — PROTIME-INR
INR: 1.15 (ref 0.00–1.49)
Prothrombin Time: 14.8 seconds (ref 11.6–15.2)

## 2016-02-03 LAB — COMPREHENSIVE METABOLIC PANEL
ALBUMIN: 2.6 g/dL — AB (ref 3.5–5.0)
ALT: 23 U/L (ref 14–54)
AST: 21 U/L (ref 15–41)
Alkaline Phosphatase: 41 U/L (ref 38–126)
Anion gap: 8 (ref 5–15)
BUN: 14 mg/dL (ref 6–20)
CHLORIDE: 115 mmol/L — AB (ref 101–111)
CO2: 18 mmol/L — AB (ref 22–32)
CREATININE: 0.72 mg/dL (ref 0.44–1.00)
Calcium: 7.7 mg/dL — ABNORMAL LOW (ref 8.9–10.3)
GFR calc Af Amer: >60 mL/min (ref >60)
Glucose, Bld: 96 mg/dL (ref 65–99)
POTASSIUM: 3.4 mmol/L — AB (ref 3.5–5.1)
SODIUM: 141 mmol/L (ref 135–145)
Total Bilirubin: 0.7 mg/dL (ref 0.3–1.2)
Total Protein: 4.8 g/dL — ABNORMAL LOW (ref 6.5–8.1)

## 2016-02-03 LAB — CBC
HEMATOCRIT: 34.3 % — AB (ref 36.0–46.0)
Hemoglobin: 11.8 g/dL — ABNORMAL LOW (ref 12.0–15.0)
MCH: 29.8 pg (ref 26.0–34.0)
MCHC: 34.4 g/dL (ref 30.0–36.0)
MCV: 86.6 fL (ref 78.0–100.0)
PLATELETS: 129 10*3/uL — AB (ref 150–400)
RBC: 3.96 MIL/uL (ref 3.87–5.11)
RDW: 14.3 % (ref 11.5–15.5)
WBC: 9.3 10*3/uL (ref 4.0–10.5)

## 2016-02-03 LAB — GLUCOSE, CAPILLARY
GLUCOSE-CAPILLARY: 136 mg/dL — AB (ref 65–99)
GLUCOSE-CAPILLARY: 105 mg/dL — AB (ref 65–99)
Glucose-Capillary: 52 mg/dL — ABNORMAL LOW (ref 65–99)
Glucose-Capillary: 79 mg/dL (ref 65–99)

## 2016-02-03 LAB — C DIFFICILE QUICK SCREEN W PCR REFLEX
C DIFFICILE (CDIFF) INTERP: NEGATIVE
C DIFFICLE (CDIFF) ANTIGEN: NEGATIVE
C Diff toxin: NEGATIVE

## 2016-02-03 LAB — URINE CULTURE: CULTURE: NO GROWTH

## 2016-02-03 LAB — APTT: APTT: 25 s (ref 24–37)

## 2016-02-03 NOTE — Progress Notes (Signed)
[facility] TEAM 1 - Stepdown/ICU TEAM Progress Note  Felicia Mills WJX:[id number] DOB: 1951/06/27 DOA: 02/01/2016 PCP: No primary care provider on file.  Admit HPI / Brief Narrative: Felicia Mills is a 65 y.o.WF PMHx HTN, HLD, DM Type 2  presents to the ED with c/o fever, chills, mild headache, left groin and lower leg pain. Nausea but no vomiting, no diarrhea, no cough, no SOB, no chest pain. Symptoms onset suddenly and severely around 5:30 PM this evening. She presents to the ED for evaluation. During her stay in the ED the L shin has become progressively erythematous (suggestive of source). She has had this presentation in the past with cellulitis of the leg she says, most recently 3 years ago. Systemic symptoms before skin symptoms occurred at that time as well.   HPI/Subjective: 2/16 A/O 4. States had similar episode couple years ago. No known injury to extremity.  Assessment/Plan:  Sepsis unspecified organism /L LE cellulitis  -Improving continue current treatment    Hypotension w/ hx of HTN -Resolved with hydration -Continue normal saline 142ml/hr  DM Type 2 uncontrolled -2/16 hemoglobin A1c= 11.8 -Continue moderate SSI -Consult to diabetic coordinator placed -Consult to nutritionist placed    Code Status: FULL Family Communication: Husband present at time of exam Disposition Plan: Resolution cellulitis    Consultants: NA  Procedure/Significant Events: 2/15 CT left tibia/fibula; negative osteomyelitis/abscess   Culture NA  Antibiotics: Zosyn 2/14 > Vancomycin 2/14 >   DVT prophylaxis: Lovenox   Devices NA   LINES / TUBES:  NA    Continuous Infusions: . sodium chloride 150 mL/hr at 02/03/16 0054    Objective: VITAL SIGNS: Temp: 98.9 F (37.2 C) (02/16 1751) Temp Source: Oral (02/16 1751) BP: 112/54 mmHg (02/16 1700) Pulse Rate: 72 (02/16 1700) SPO2; FIO2:   Intake/Output Summary (Last 24 hours) at 02/03/16 1923 Last data  filed at 02/03/16 1700  Gross per 24 hour  Intake   3900 ml  Output    950 ml  Net   2950 ml     Exam: General: A/O 4 No acute respiratory distress Eyes: Negative headache, negative scleral hemorrhage ENT: Negative Runny nose, negative gingival bleeding, Neck:  Negative scars, masses, torticollis, lymphadenopathy, JVD Lungs: Clear to auscultation bilaterally without wheezes or crackles Cardiovascular: Regular rate and rhythm without murmur gallop or rub normal S1 and S2 Abdomen:negative abdominal pain, nondistended, positive soft, bowel sounds, no rebound, no ascites, no appreciable mass Extremities: negative cyanosis LLE improved from previous day, positive pain to palpation, positive erythema, negative signs of trauma.  Psychiatric:  Negative depression, negative anxiety, negative fatigue, negative mania Neurologic:  Cranial nerves II through XII intact, tongue/uvula midline, all extremities muscle strength 5/5, sensation intact throughout, negative dysarthria, negative expressive aphasia, negative receptive aphasia.   Data Reviewed: Basic Metabolic Panel:  Recent Labs Lab 02/01/16 2033 02/02/16 0310 02/03/16 0400  NA 137 137 141  K 4.0 3.7 3.4*  CL 103 109 115*  CO2 21* 20* 18*  GLUCOSE 151* 161* 96  BUN CREATININE 0.77 0.79 0.72  CALCIUM 9.4 7.7* 7.7*   Liver Function Tests:  Recent Labs Lab 02/01/16 2033 02/03/16 0400  AST 24 21  ALT 29 23  ALKPHOS 69 41  BILITOT 0.8 0.7  PROT 6.8 4.8*  ALBUMIN 4.0 2.6*   No results for input(s): LIPASE, AMYLASE in the last 168 hours. No results for input(s): AMMONIA in the last 168 hours. CBC:  Recent Labs Lab 02/01/16 2033  02/02/16 0310 02/03/16 0400  WBC 14.7* 18.0* 9.3  NEUTROABS 12.7*  --   --   HGB 13.4 11.1* 11.8*  HCT 39.6 33.6* 34.3*  MCV 86.3 86.6 86.6  PLT 199 167 129*   Cardiac Enzymes: No results for input(s): CKTOTAL, CKMB, CKMBINDEX, TROPONINI in the last 168 hours. BNP (last 3  results) No results for input(s): BNP in the last 8760 hours.  ProBNP (last 3 results) No results for input(s): PROBNP in the last 8760 hours.  CBG:  Recent Labs Lab 02/02/16 2210 02/03/16 0834 02/03/16 0857 02/03/16 1121 02/03/16 1757  GLUCAP 84 52* 79 136* 105*    Recent Results (from the past 240 hour(s))  Culture, blood (routine x 2)     Status: None (Preliminary result)   Collection Time: 02/01/16  9:06 PM  Result Value Ref Range Status   Specimen Description BLOOD RIGHT UPPER ARM  Final   Special Requests BOTTLES DRAWN AEROBIC AND ANAEROBIC 5CC  Final   Culture NO GROWTH 2 DAYS  Final   Report Status PENDING  Incomplete  Culture, blood (routine x 2)     Status: None (Preliminary result)   Collection Time: 02/01/16  9:12 PM  Result Value Ref Range Status   Specimen Description BLOOD RIGHT WRIST  Final   Special Requests IN PEDIATRIC BOTTLE 2CC  Final   Culture NO GROWTH 2 DAYS  Final   Report Status PENDING  Incomplete  Urine culture     Status: None   Collection Time: 02/01/16 11:10 PM  Result Value Ref Range Status   Specimen Description URINE, CATHETERIZED  Final   Special Requests NONE  Final   Culture NO GROWTH 1 DAY  Final   Report Status 02/03/2016 FINAL  Final  MRSA PCR Screening     Status: None   Collection Time: 02/02/16  7:06 AM  Result Value Ref Range Status   MRSA by PCR NEGATIVE NEGATIVE Final    Comment:        The GeneXpert MRSA Assay (FDA approved for NASAL specimens only), is one component of a comprehensive MRSA colonization surveillance program. It is not intended to diagnose MRSA infection nor to guide or monitor treatment for MRSA infections.      Studies:  Recent x-ray studies have been reviewed in detail by the Attending Physician  Scheduled Meds:  Scheduled Meds: . aspirin  325 mg Oral Daily  . enoxaparin (LOVENOX) injection  40 mg Subcutaneous Daily  . insulin aspart  0-15 Units Subcutaneous TID WC  .  piperacillin-tazobactam (ZOSYN)  IV  3.375 g Intravenous Q8H  . simvastatin  40 mg Oral QHS  . sodium chloride flush  3 mL Intravenous Q12H  . vancomycin  1,000 mg Intravenous Q12H    Time spent on care of this patient: 40 mins   WOODS, Roselind Messier , MD  [facility] Office  [phone] Pager - [phone]  On-Call/Text Page:      [email or web address]      password [email or web address]  If 7PM-7AM, please contact night-coverage [email or web address] Password [email or web address] 02/03/2016, 7:23 PM   LOS: 1 day   Care during the described time interval was provided by me .  I have reviewed this patient's available data, including medical history, events of note, physical examination, and all test results as part of my evaluation. I have personally reviewed and interpreted all radiology studies.   Carolyne Littles, MD [phone] Pager

## 2016-02-03 NOTE — Plan of Care (Signed)
Problem: Nutrition: Goal: Adequate nutrition will be maintained Outcome: Progressing Carb mod diet

## 2016-02-03 NOTE — Progress Notes (Signed)
Hypoglycemic Event  CBG: 52  Treatment: 15 GM carbohydrate snack  Symptoms: Hungry  Follow-up CBG: [id number] CBG Result:79  Possible Reasons for Event: Inadequate meal intake  Comments/MD notified:    Beabrout,Fawaz Borquez L

## 2016-02-04 ENCOUNTER — Inpatient Hospital Stay (HOSPITAL_COMMUNITY): Payer: MEDICAID

## 2016-02-04 DIAGNOSIS — I82402 Acute embolism and thrombosis of unspecified deep veins of left lower extremity: Secondary | ICD-10-CM

## 2016-02-04 LAB — GLUCOSE, CAPILLARY
GLUCOSE-CAPILLARY: 108 mg/dL — AB (ref 65–99)
GLUCOSE-CAPILLARY: 97 mg/dL (ref 65–99)
Glucose-Capillary: 84 mg/dL (ref 65–99)
Glucose-Capillary: 114 mg/dL — ABNORMAL HIGH (ref 65–99)

## 2016-02-04 LAB — PROCALCITONIN: PROCALCITONIN: 1.39 ng/mL

## 2016-02-04 MED ORDER — METFORMIN HCL 500 MG PO TABS
1000.0000 mg | ORAL_TABLET | Freq: Two times a day (BID) | ORAL | Status: DC
  Administered 2016-02-04 – 2016-02-05 (×2): 1000 mg via ORAL
  Filled 2016-02-04 (×2): qty 2

## 2016-02-04 MED ORDER — ALBUTEROL SULFATE (2.5 MG/3ML) 0.083% IN NEBU: 2.5000 mg | INHALATION_SOLUTION | RESPIRATORY_TRACT | Status: DC | PRN

## 2016-02-04 MED ORDER — POTASSIUM CHLORIDE CRYS ER 20 MEQ PO TBCR
40.0000 meq | EXTENDED_RELEASE_TABLET | Freq: Once | ORAL | Status: AC
  Administered 2016-02-04: 40 meq via ORAL
  Filled 2016-02-04: qty 2

## 2016-02-04 MED ORDER — CEPHALEXIN 500 MG PO CAPS
500.0000 mg | ORAL_CAPSULE | Freq: Three times a day (TID) | ORAL | Status: DC
  Administered 2016-02-04 – 2016-02-05 (×3): 500 mg via ORAL
  Filled 2016-02-04 (×3): qty 1

## 2016-02-04 MED ORDER — MAGIC MOUTHWASH W/LIDOCAINE
5.0000 mL | Freq: Four times a day (QID) | ORAL | Status: DC | PRN
  Administered 2016-02-05 (×2): 5 mL via ORAL
  Filled 2016-02-04 (×3): qty 5

## 2016-02-04 MED ORDER — CANAGLIFLOZIN 300 MG PO TABS
300.0000 mg | ORAL_TABLET | Freq: Every day | ORAL | Status: DC
Start: 2016-02-04 — End: 2016-02-05
  Administered 2016-02-04: 300 mg via ORAL
  Filled 2016-02-04 (×2): qty 1

## 2016-02-04 NOTE — Progress Notes (Signed)
Inpatient Diabetes Program Recommendations  AACE/ADA: New Consensus Statement on Inpatient Glycemic Control (2015)  Target Ranges:  Prepandial:   less than 140 mg/dL      Peak postprandial:   less than 180 mg/dL (1-2 hours)      Critically ill patients:  140 - 180 mg/dL   Review of Glycemic Control  Diabetes history: DM 2 Outpatient Diabetes medications: Metformin 1000 mg bid and Invokana 300 mg qd and glipizide XR 10 mg qd Current orders for Inpatient glycemic control: Invokana 300 mg and Metformin 1000 mg daily and  Moderate correction tidwc.  Inpatient Diabetes Program Recommendations:    Glucose is well controlled requiring no correction insulin over past 3 days. Noted Invokana and metformin ordered to start today. May consider not using these orals at this time, as moderate correction should be enough if even needed.  Also noted consult for uncontrolled dm with A1C of 11.8%, but cannot find a HgbA1C documented since last year at which time it was 7.2%. Also noted patient has had no PCP, but she has been on Invokana, Glipizide XR and Metformin (?).  Unsure of who would be prescribing these meds.  Will talk with patient regarding her dm control and medications.   Thank you Lenor Coffin, RN, MSN, CDE  Diabetes Inpatient Program Office: [phone] Pager: [phone] 8:00 am to 5:00 pm

## 2016-02-04 NOTE — Progress Notes (Signed)
Initial Nutrition Assessment  DOCUMENTATION CODES:   Obesity unspecified  INTERVENTION:  Provide nourishment snacks. (Ordered).  Encourage adequate PO intake.   Diet education given.  NUTRITION DIAGNOSIS:   Inadequate oral intake related to poor appetite as evidenced by per patient/family report.  GOAL:   Patient will meet greater than or equal to 90% of their needs  MONITOR:   PO intake, Weight trends, Labs, I & O's, Skin  REASON FOR ASSESSMENT:   Consult Assessment of nutrition requirement/status  ASSESSMENT:   65 year old with history of type 2 diabetes mellitus.she is admitted on 2/14 with left lower extremity cellulitis, septic shock.   Pt reports having a decreased appetite since admission. Meal completion has been 40-75%. Pt reports eating well PTA with consumption of at least 3 meals a day with no other difficulties. Pt reports no changes in weight. Pt was offered Glucerna Shake, however refused. Pt agreeable to nourishment snacks instead to aid in caloric and protein needs. RD to order. Pt was encouraged to eat her food at meals. Pt was additionally given handout "Carbohydrate counting for people with diabetes" from the Academy of Nutrition and Dietetics Manual. Reviewed the importance of carbohydrate servings sizes and diabetic friendly drink options.   Pt with no observed significant fat or muscle mass loss.   Labs and medications reviewed.   Diet Order:  Diet Carb Modified Fluid consistency:: Thin; Room service appropriate?: Yes  Skin:  Reviewed, no issues  Last BM:  2/16  Height:   Ht Readings from Last 1 Encounters:  02/02/16  (1.549 m)    Weight:   Wt Readings from Last 1 Encounters:  02/02/16 182 lb 8.7 oz (82.8 kg)    Ideal Body Weight:  47.7 kg  BMI:  Body mass index is 34.51 kg/(m^2).  Estimated Nutritional Needs:   Kcal:  1800-2000  Protein:  85-95 grams  Fluid:  1.8 - 2 L/day  EDUCATION NEEDS:   Education needs  addressed  Roslyn Smiling, MS, RD, LDN Pager # [phone] After hours/ weekend pager # [phone]

## 2016-02-04 NOTE — Progress Notes (Signed)
Pharmacy Antibiotic Note  Felicia Mills is a 65 y.o. female admitted on 02/01/2016 with cellulitis.  Pharmacy has been consulted for vancomycin and zosyn dosing.  Plan: 1. Continue vancomycin and zosyn at current doses. 2. F/u plans for LOT?  Narrow soon?  If vancomycin to continue, will check trough level soon.  Height:  (154.9 cm) Weight: 182 lb 8.7 oz (82.8 kg) IBW/kg (Calculated) : 47.8  Temp (24hrs), Avg:98.7 F (37.1 C), Min:98.1 F (36.7 C), Max:99.1 F (37.3 C)   Recent Labs Lab 02/01/16 2033 02/01/16 2036 02/01/16 2320 02/02/16 0310 02/02/16 0402 02/03/16 0400  WBC 14.7*  --   --  18.0*  --  9.3  CREATININE 0.77  --   --  0.79  --  0.72  LATICACIDVEN  --  1.87 1.21  --  0.97  --     Estimated Creatinine Clearance: 71.1 mL/min (by C-G formula based on Cr of 0.72).    Allergies  Allergen Reactions  . Vicodin [Hydrocodone-Acetaminophen] Nausea And Vomiting    Antimicrobials this admission: Vanc 2/14 >> Zosyn 2/14 >>  Dose adjustments this admission: none  Microbiology results: 2/14 BCx: ngtd 2/14 UCx: neg MRSA PCR neg  Thank you for allowing pharmacy to be a part of this patient's care.  Tad Moore, BCPS  Clinical Pharmacist Pager [phone]  02/04/2016 11:02 AM

## 2016-02-04 NOTE — Progress Notes (Addendum)
[facility] TEAM 1 - Stepdown/ICU TEAM PROGRESS NOTE  SHANISE BALCH ION:[id number] DOB: [birth year] DOA: 02/01/2016 PCP: No primary care provider on file.  Admit HPI / Brief Narrative: 65 y.o. female who presented to the ED with c/o fever, chills, mild headache, left groin and lower leg pain. Nausea but no vomiting, no diarrhea, no cough, no SOB, no chest pain. Symptom onset was sudden and severely around 5:30 PM the night of presentation. During her stay in the ED the L shin became progressively erythematous. She reported this same presentation in the past with cellulitis of the leg, most recently 3 years prior.   HPI/Subjective: The patient is resting comfortably in bed at the time of visit.  She tells me she was able to ambulate down the hall with assistance without significant difficulty.  She denies chest pain shortness breath fevers or chills.  She states the erythema and swelling in her left lower stream have improved significantly.  Assessment/Plan:  Sepsis due to L LE cellulitis  No exam or imaging findings to suggest fasciitis or abscess - has rapidly improved with empiric antibiotic therapy - narrow antibiotics tonight and change to oral dosing  Hypotension w/ hx of HTN Resolved with volume resuscitation  Mild hypokalemia Replace and follow  DM2 A1c NOT OBTAINED during this admit - CBG is currently well-controlled  Obesity - Body mass index is 34.51 kg/(m^2).  Code Status: FULL Family Communication: spoke w/ husband at bedside  Disposition Plan: Probable discharge home in a.m. if no recurrence of erythema with transitioning to oral antibiotics  Consultants: none  Procedures: none  Antibiotics: Zosyn 2/14 > 2/17 Vancomycin 2/14 > 2/17  DVT prophylaxis: lovenox  Objective: Blood pressure 118/57, pulse 76, temperature 99.1 F (37.3 C), temperature source Oral, resp. rate 18, height  (1.549 m), weight 82.8 kg (182 lb 8.7 oz), SpO2 94 %.  Intake/Output  Summary (Last 24 hours) at 02/04/16 1406 Last data filed at 02/04/16 0800  Gross per 24 hour  Intake   3240 ml  Output      0 ml  Net   3240 ml   Exam: General: No acute respiratory distress Lungs: Clear to auscultation bilaterally without wheezes or crackles Cardiovascular: Regular rate and rhythm without murmur gallop or rub normal S1 and S2 Abdomen: Nontender, nondistended, soft, bowel sounds positive, no rebound, no ascites, no appreciable mass Extremities: No significant cyanosis, or clubbing;  1+ edema B LE - almost no erythema of LLE at this point w/ no cutaneous wounds or lesions  Data Reviewed: Basic Metabolic Panel:  Recent Labs Lab 02/01/16 2033 02/02/16 0310 02/03/16 0400  NA 137 137 141  K 4.0 3.7 3.4*  CL 103 109 115*  CO2 21* 20* 18*  GLUCOSE 151* 161* 96  BUN CREATININE 0.77 0.79 0.72  CALCIUM 9.4 7.7* 7.7*    CBC:  Recent Labs Lab 02/01/16 2033 02/02/16 0310 02/03/16 0400  WBC 14.7* 18.0* 9.3  NEUTROABS 12.7*  --   --   HGB 13.4 11.1* 11.8*  HCT 39.6 33.6* 34.3*  MCV 86.3 86.6 86.6  PLT 199 167 129*    Liver Function Tests:  Recent Labs Lab 02/01/16 2033 02/03/16 0400  AST 24 21  ALT 29 23  ALKPHOS 69 41  BILITOT 0.8 0.7  PROT 6.8 4.8*  ALBUMIN 4.0 2.6*   Coags:  Recent Labs Lab 02/03/16 0400  INR 1.15    Recent Labs Lab 02/03/16 0400  APTT 25  CBG:  Recent Labs Lab 02/03/16 0857 02/03/16 1121 02/03/16 1757 02/04/16 0611 02/04/16 1138  GLUCAP 79 136* 105* 84 114*    Recent Results (from the past 240 hour(s))  Culture, blood (routine x 2)     Status: None (Preliminary result)   Collection Time: 02/01/16  9:06 PM  Result Value Ref Range Status   Specimen Description BLOOD RIGHT UPPER ARM  Final   Special Requests BOTTLES DRAWN AEROBIC AND ANAEROBIC 5CC  Final   Culture NO GROWTH 2 DAYS  Final   Report Status PENDING  Incomplete  Culture, blood (routine x 2)     Status: None (Preliminary result)    Collection Time: 02/01/16  9:12 PM  Result Value Ref Range Status   Specimen Description BLOOD RIGHT WRIST  Final   Special Requests IN PEDIATRIC BOTTLE 2CC  Final   Culture NO GROWTH 2 DAYS  Final   Report Status PENDING  Incomplete  Urine culture     Status: None   Collection Time: 02/01/16 11:10 PM  Result Value Ref Range Status   Specimen Description URINE, CATHETERIZED  Final   Special Requests NONE  Final   Culture NO GROWTH 1 DAY  Final   Report Status 02/03/2016 FINAL  Final  MRSA PCR Screening     Status: None   Collection Time: 02/02/16  7:06 AM  Result Value Ref Range Status   MRSA by PCR NEGATIVE NEGATIVE Final    Comment:        The GeneXpert MRSA Assay (FDA approved for NASAL specimens only), is one component of a comprehensive MRSA colonization surveillance program. It is not intended to diagnose MRSA infection nor to guide or monitor treatment for MRSA infections.   C difficile quick scan w PCR reflex     Status: None   Collection Time: 02/03/16  9:31 PM  Result Value Ref Range Status   C Diff antigen NEGATIVE NEGATIVE Final   C Diff toxin NEGATIVE NEGATIVE Final   C Diff interpretation Negative for toxigenic C. difficile  Final     Studies:   Recent x-ray studies have been reviewed in detail by the Attending Physician  Scheduled Meds:  Scheduled Meds: . aspirin  325 mg Oral Daily  . enoxaparin (LOVENOX) injection  40 mg Subcutaneous Daily  . insulin aspart  0-15 Units Subcutaneous TID WC  . piperacillin-tazobactam (ZOSYN)  IV  3.375 g Intravenous Q8H  . simvastatin  40 mg Oral QHS  . sodium chloride flush  3 mL Intravenous Q12H  . vancomycin  1,000 mg Intravenous Q12H    Time spent on care of this patient: 25 mins   MCCLUNG,JEFFREY T , MD   [facility] Office  [phone] Pager - Text Page per Loretha Stapler as per below:  On-Call/Text Page:      [email or web address]      password [email or web address]  If 7PM-7AM, please contact  night-coverage [email or web address] Password [email or web address] 02/04/2016, 2:06 PM   LOS: 2 days

## 2016-02-04 NOTE — Progress Notes (Addendum)
*  Preliminary Results* Left lower extremity venous duplex completed. Left lower extremity is negative for deep vein thrombosis. There is no evidence of left Baker's cyst.  There is evidence of a heterogenous area of the left groin measuring 1.9cm, suggestive of an inguinal lymph node.  02/04/2016 4:17 PM  Gertie Fey, RVT, RDCS, RDMS

## 2016-02-05 LAB — CBC
HEMATOCRIT: 34.3 % — AB (ref 36.0–46.0)
HEMOGLOBIN: 11.7 g/dL — AB (ref 12.0–15.0)
MCH: 29.8 pg (ref 26.0–34.0)
MCHC: 34.1 g/dL (ref 30.0–36.0)
MCV: 87.5 fL (ref 78.0–100.0)
Platelets: 172 10*3/uL (ref 150–400)
RBC: 3.92 MIL/uL (ref 3.87–5.11)
RDW: 14.1 % (ref 11.5–15.5)
WBC: 6.9 10*3/uL (ref 4.0–10.5)

## 2016-02-05 LAB — MAGNESIUM: MAGNESIUM: 1.9 mg/dL (ref 1.7–2.4)

## 2016-02-05 LAB — GLUCOSE, CAPILLARY
Glucose-Capillary: 139 mg/dL — ABNORMAL HIGH (ref 65–99)
Glucose-Capillary: 78 mg/dL (ref 65–99)

## 2016-02-05 LAB — BASIC METABOLIC PANEL
Anion gap: 9 (ref 5–15)
BUN: 11 mg/dL (ref 6–20)
CHLORIDE: 115 mmol/L — AB (ref 101–111)
CO2: 17 mmol/L — AB (ref 22–32)
Calcium: 8.4 mg/dL — ABNORMAL LOW (ref 8.9–10.3)
Creatinine, Ser: 0.69 mg/dL (ref 0.44–1.00)
GFR calc Af Amer: >60 mL/min (ref >60)
GFR calc non Af Amer: >60 mL/min (ref >60)
Glucose, Bld: 128 mg/dL — ABNORMAL HIGH (ref 65–99)
POTASSIUM: 4.1 mmol/L (ref 3.5–5.1)
SODIUM: 141 mmol/L (ref 135–145)

## 2016-02-05 MED ORDER — LOPERAMIDE HCL 2 MG PO CAPS
2.0000 mg | ORAL_CAPSULE | ORAL | Status: DC | PRN
  Administered 2016-02-05: 2 mg via ORAL
  Filled 2016-02-05: qty 1

## 2016-02-05 MED ORDER — LOPERAMIDE HCL 2 MG PO CAPS: 2.0000 mg | ORAL_CAPSULE | ORAL | Status: DC | PRN

## 2016-02-05 MED ORDER — CIPROFLOXACIN HCL 500 MG PO TABS
500.0000 mg | ORAL_TABLET | Freq: Two times a day (BID) | ORAL | Status: DC
Start: 2016-02-05 — End: 2021-08-09

## 2016-02-05 MED ORDER — CIPROFLOXACIN HCL 500 MG PO TABS
500.0000 mg | ORAL_TABLET | Freq: Two times a day (BID) | ORAL | Status: DC
  Administered 2016-02-05: 500 mg via ORAL
  Filled 2016-02-05: qty 1

## 2016-02-05 MED ORDER — SACCHAROMYCES BOULARDII 250 MG PO CAPS
250.0000 mg | ORAL_CAPSULE | Freq: Two times a day (BID) | ORAL | Status: DC
  Administered 2016-02-05: 250 mg via ORAL
  Filled 2016-02-05: qty 1

## 2016-02-05 MED ORDER — FUROSEMIDE 10 MG/ML IJ SOLN
40.0000 mg | Freq: Two times a day (BID) | INTRAMUSCULAR | Status: DC
  Administered 2016-02-05: 40 mg via INTRAVENOUS
  Filled 2016-02-05: qty 4

## 2016-02-05 MED ORDER — SACCHAROMYCES BOULARDII 250 MG PO CAPS: 250.0000 mg | ORAL_CAPSULE | Freq: Two times a day (BID) | ORAL | Status: DC

## 2016-02-05 MED ORDER — POTASSIUM CHLORIDE CRYS ER 20 MEQ PO TBCR
20.0000 meq | EXTENDED_RELEASE_TABLET | Freq: Two times a day (BID) | ORAL | Status: DC
  Administered 2016-02-05: 20 meq via ORAL
  Filled 2016-02-05: qty 1

## 2016-02-05 NOTE — Discharge Instructions (Signed)
Monitor your blood sugars (CBG) at home.  Do not take Invokana until you are seen by our regular doctor in follow up.  If your sugars are < 100 on 3 subsequent checks, stop taking your Glucotrol, and keep taking only the Metformin.     Cellulitis  Cellulitis is an infection of the skin and the tissue beneath it. The infected area is usually red and tender. Cellulitis occurs most often in the arms and lower legs.  CAUSES  Cellulitis is caused by bacteria that enter the skin through cracks or cuts in the skin. The most common types of bacteria that cause cellulitis are staphylococci and streptococci. SIGNS AND SYMPTOMS   Redness and warmth.  Swelling.  Tenderness or pain.  Fever. DIAGNOSIS  Your health care provider can usually determine what is wrong based on a physical exam. Blood tests may also be done. TREATMENT  Treatment usually involves taking an antibiotic medicine. HOME CARE INSTRUCTIONS   Take your antibiotic medicine as directed by your health care provider. Finish the antibiotic even if you start to feel better.  Keep the infected arm or leg elevated to reduce swelling.  Apply a warm cloth to the affected area up to 4 times per day to relieve pain.  Take medicines only as directed by your health care provider.  Keep all follow-up visits as directed by your health care provider. SEEK MEDICAL CARE IF:   You notice red streaks coming from the infected area.  Your red area gets larger or turns dark in color.  Your bone or joint underneath the infected area becomes painful after the skin has healed.  Your infection returns in the same area or another area.  You notice a swollen bump in the infected area.  You develop new symptoms.  You have a fever. SEEK IMMEDIATE MEDICAL CARE IF:   You feel very sleepy.  You develop vomiting or diarrhea.  You have a general ill feeling (malaise) with muscle aches and pains.   This information is not intended to replace  advice given to you by your health care provider. Make sure you discuss any questions you have with your health care provider.   Document Released: 09/13/2005 Document Revised: 08/25/2015 Document Reviewed: 02/19/2012 Elsevier Interactive Patient Education 2016 Else[employer] Inc[employer]   Antibiotic Medicine  Antibiotic medicines are used to treat infections caused by bacteria. They work by injuring or killing the bacteria that is making you sick. HOW IS AN ANTIBIOTIC CHOSEN? An antibiotic is chosen based on many factors. To help your health care provider choose one for you, tell your health care provider if:  You have any allergies.  You are pregnant or plan to get pregnant.  You are breastfeeding.  You are taking any medicines. These include over-the-counter medicines, prescription medicines, and herbal remedies.  You have a medical condition or problem you have not already discussed. Your health care provider will also consider:  How often the medicine has to be taken.  Common side effects of the medicine.  The cost of the medicine.  The taste of the medicine. If you have questions about why an antibiotic was chosen, make sure to ask. FOR HOW LONG SHOULD I TAKE MY ANTIBIOTIC? Continue to take your antibiotic for as long as told by your health care provider. Do not stop taking it when you feel better. If you stop taking it too soon:  You may start to feel sick again.  Your infection may become harder to treat.  Complications  may develop. WHAT IF I MISS A DOSE? Try not to miss any doses of medicine. If you miss a dose, take it as soon as possible. However, if it is almost time for the next dose:  If you are taking 2 doses per day, take the missed dose and the next dose 5 to 6 hours apart.  If you are taking 3 or more doses per day, take the missed dose and the next dose 2 to 4 hours apart, then go back to the normal schedule. If you cannot make up a missed dose, take the next  scheduled dose on time. Then take the missed dose after you have taken all the doses as recommended by your health care provider, as if you had one more dose left. DO ANTIBIOTICS AFFECT BIRTH CONTROL? Birth control pills may not work while you are on antibiotics. If you are taking birth control pills, continue taking them as usual and use a second form of birth control, such as a condom, to avoid unwanted pregnancy. Continue using the second form of birth control until you are finished with your current 1 month cycle of birth control pills. OTHER INFORMATION  If there is any medicine left over, throw it away.  Never take someone else's antibiotics.  Never take leftover antibiotics. SEEK MEDICAL CARE IF:  You get worse.  You do not feel better within a few days of starting the antibiotic medicine.  You vomit.  White patches appear in your mouth.  You have new joint pain that begins after starting the antibiotic.  You have new muscle aches that begin after starting the antibiotic.  You had a fever before starting the antibiotic and it returns.  You have any symptoms of an allergic reaction, such as an itchy rash. If this happens, stop taking the antibiotic. SEEK IMMEDIATE MEDICAL CARE IF:  Your urine turns dark or becomes blood-colored.  Your skin turns yellow.  You bruise or bleed easily.  You have severe diarrhea and abdominal cramps.  You have a severe headache.  You have signs of a severe allergic reaction, such as:  Trouble breathing.  Wheezing.  Swelling of the lips, tongue, or face.  Fainting.  Blisters on the skin or in the mouth. If you have signs of a severe allergic reaction, stop taking the antibiotic right away.   This information is not intended to replace advice given to you by your health care provider. Make sure you discuss any questions you have with your health care provider.   Document Released: 08/16/2004 Document Revised: 08/25/2015 Document  Reviewed: 04/21/2015 Elsevier Interactive Patient Education [employer]

## 2016-02-05 NOTE — Progress Notes (Signed)
At 1310, Pt ambulated about 200 feet and was able to maintain oxygen saturation 95 and above, except for a brief episode when her saturation was 89%, but her level soon came up into the mid 90's.  Patient reported no shortness of breath during the walk.  Dr. Sharon Seller notified who said the patient was ok to discharge.

## 2016-02-05 NOTE — Discharge Summary (Signed)
DISCHARGE SUMMARY  Felicia Mills  MR#: [id number]  DOB:[birth year]  Date of Admission: 02/01/2016 Date of Discharge: 02/05/2016  Attending Physician:Maurene Hollin T  Patient's PCP:No primary care provider on file.  Consults: none  Disposition: D/C home   Follow-up Appts:     Follow-up Information    Follow up with Your Primary Care Doctor. Schedule an appointment as soon as possible for a visit in 1 week.     Tests Needing Follow-up: -recheck of L LE for signs of recurring cellulitis  -assure diarrhea has resolved following completion of abx course -evaluation of CBG log   Discharge Diagnoses: Sepsis due to L LE cellulitis  Hypotension w/ hx of HTN Iatrogenic volume overload due to tx of sepsis  Mild hypokalemia DM2 Obesity - Body mass index is 34.51 kg/(m^2)  Initial presentation: 65 y.o. female who presented to the ED with c/o fever, chills, mild headache, left groin and lower leg pain. Nausea but no vomiting, no diarrhea, no cough, no SOB, no chest pain. Symptom onset was sudden and severely around 5:30 PM the night of presentation. During her stay in the ED the L shin became progressively erythematous. She reported this same presentation in the past with cellulitis of the leg, most recently 3 years prior.   Hospital Course:  Sepsis due to L LE cellulitis  No exam or imaging findings to suggest fasciitis or abscess during this stay - rapidly improved with empiric antibiotic therapy - venous duplex negative for DVT in L LE - narrowed antibiotics and changed to oral the day before her d/c - to complete 10 full days of tx - pt instructed to return to the ED for immediate evaluation should severe erythema, pain, chills, or fever recur   Hypotension w/ hx of HTN Resolved with volume resuscitation - BP trending upward at time of d/c - to resume usual home BP medications   Iatrogenic volume overload due to tx of sepsis  signif lower extremity edema noted after  volume resuscitation, along w/ intermittent wheezing and signif DOE - diuresed short term, but no hx to suggest actual CHF - ambulatory sats evaluated prior to d/c   Mild hypokalemia Replaced to normal range   DM2 A1c NOT OBTAINED during this admit  - CBG well controlled during stay utilizing only SSI - resume usual home oral meds at time of d/cw/ exception to Invokana - pt advised to check CBGs at home and to hold glucotrol if CBG consistently <100 - advised to resume Invokana should her CBG remain consistently > 160  Obesity - Body mass index is 34.51 kg/(m^2).     Medication List    STOP taking these medications        INVOKANA 300 MG Tabs tablet  Generic drug:  canagliflozin      TAKE these medications        AMBIEN 10 MG tablet  Generic drug:  zolpidem  Take 10 mg by mouth at bedtime as needed.     aspirin 325 MG tablet  Take 325 mg by mouth daily.     ciprofloxacin 500 MG tablet  Commonly known as:  CIPRO  Take 1 tablet (500 mg total) by mouth 2 (two) times daily.     GLUCOTROL XL 10 MG 24 hr tablet  Generic drug:  glipiZIDE  Take 10 mg by mouth daily.     lisinopril-hydrochlorothiazide 10-12.5 MG tablet  Commonly known as:  PRINZIDE,ZESTORETIC  Take 1 tablet by mouth daily.     loperamide  2 MG capsule  Commonly known as:  IMODIUM  Take 1 capsule (2 mg total) by mouth as needed for diarrhea or loose stools.     metFORMIN 1000 MG tablet  Commonly known as:  GLUCOPHAGE  Take 1,000 mg by mouth 2 (two) times daily.     Potassium 75 MG Tabs  Take 75 mg by mouth daily.     saccharomyces boulardii 250 MG capsule  Commonly known as:  FLORASTOR  Take 1 capsule (250 mg total) by mouth 2 (two) times daily.     simvastatin 40 MG tablet  Commonly known as:  ZOCOR  Take 40 mg by mouth at bedtime.        Day of Discharge BP 145/85 mmHg  Pulse 77  Temp(Src) 98.4 F (36.9 C) (Oral)  Resp 18  Ht  (1.549 m)  Wt 82.8 kg (182 lb 8.7 oz)  BMI 34.51 kg/m2   SpO2 94%  Physical Exam: General: No acute respiratory distress  Lungs: Clear to auscultation bilaterally without wheezes or crackles Cardiovascular: Regular rate and rhythm without murmur gallop or rub normal S1 and S2 Abdomen: Nontender, nondistended, soft, bowel sounds positive, no rebound, no ascites, no appreciable mass Extremities: No significant cyanosis, or clubbing B LE - L LE w/ complete resolution of erythema w/o cutanous break/lesion - 2+ B LE edema to thighs   Basic Metabolic Panel:  Recent Labs Lab 02/01/16 2033 02/02/16 0310 02/03/16 0400 02/05/16 0351  NA 137 137 141 141  K 4.0 3.7 3.4* 4.1  CL 103 109 115* 115*  CO2 21* 20* 18* 17*  GLUCOSE 151* 161* 96 128*  BUN CREATININE 0.77 0.79 0.72 0.69  CALCIUM 9.4 7.7* 7.7* 8.4*  MG  --   --   --  1.9    Liver Function Tests:  Recent Labs Lab 02/01/16 2033 02/03/16 0400  AST 24 21  ALT 29 23  ALKPHOS 69 41  BILITOT 0.8 0.7  PROT 6.8 4.8*  ALBUMIN 4.0 2.6*   Coags:  Recent Labs Lab 02/03/16 0400  INR 1.15   CBC:  Recent Labs Lab 02/01/16 2033 02/02/16 0310 02/03/16 0400 02/05/16 0351  WBC 14.7* 18.0* 9.3 6.9  NEUTROABS 12.7*  --   --   --   HGB 13.4 11.1* 11.8* 11.7*  HCT 39.6 33.6* 34.3* 34.3*  MCV 86.3 86.6 86.6 87.5  PLT 199 167 129* 172   CBG:  Recent Labs Lab 02/04/16 0611 02/04/16 1138 02/04/16 1721 02/04/16 2204 02/05/16 0631  GLUCAP 84 114* 108* 97 139*    Recent Results (from the past 240 hour(s))  Culture, blood (routine x 2)     Status: None (Preliminary result)   Collection Time: 02/01/16  9:06 PM  Result Value Ref Range Status   Specimen Description BLOOD RIGHT UPPER ARM  Final   Special Requests BOTTLES DRAWN AEROBIC AND ANAEROBIC 5CC  Final   Culture NO GROWTH 3 DAYS  Final   Report Status PENDING  Incomplete  Culture, blood (routine x 2)     Status: None (Preliminary result)   Collection Time: 02/01/16  9:12 PM  Result Value Ref Range Status    Specimen Description BLOOD RIGHT WRIST  Final   Special Requests IN PEDIATRIC BOTTLE 2CC  Final   Culture NO GROWTH 3 DAYS  Final   Report Status PENDING  Incomplete  Urine culture     Status: None   Collection Time: 02/01/16 11:10 PM  Result  Value Ref Range Status   Specimen Description URINE, CATHETERIZED  Final   Special Requests NONE  Final   Culture NO GROWTH 1 DAY  Final   Report Status 02/03/2016 FINAL  Final  MRSA PCR Screening     Status: None   Collection Time: 02/02/16  7:06 AM  Result Value Ref Range Status   MRSA by PCR NEGATIVE NEGATIVE Final    Comment:        The GeneXpert MRSA Assay (FDA approved for NASAL specimens only), is one component of a comprehensive MRSA colonization surveillance program. It is not intended to diagnose MRSA infection nor to guide or monitor treatment for MRSA infections.   C difficile quick scan w PCR reflex     Status: None   Collection Time: 02/03/16  9:31 PM  Result Value Ref Range Status   C Diff antigen NEGATIVE NEGATIVE Final   C Diff toxin NEGATIVE NEGATIVE Final   C Diff interpretation Negative for toxigenic C. difficile  Final    Time spent in discharge (includes decision making & examination of pt): >35 minutes  02/05/2016, 9:14 AM   Lonia Blood, MD [facility] Office  [phone] Pager [phone]  On-Call/Text Page:      [email or web address]      password [facility]

## 2016-02-05 NOTE — Progress Notes (Signed)
MD, patient thinks that her abx is giving her upset stomach and diarrhea.  She is requesting an anti-diarrhea medication for today. Thank you.

## 2016-02-06 LAB — CULTURE, BLOOD (ROUTINE X 2)
Culture: NO GROWTH
Culture: NO GROWTH

## 2018-09-03 ENCOUNTER — Other Ambulatory Visit: Payer: Self-pay | Admitting: Physician Assistant

## 2018-09-03 DIAGNOSIS — R920 Mammographic microcalcification found on diagnostic imaging of breast: Secondary | ICD-10-CM

## 2018-09-03 DIAGNOSIS — E2839 Other primary ovarian failure: Secondary | ICD-10-CM

## 2018-11-01 ENCOUNTER — Ambulatory Visit
Admission: RE | Admit: 2018-11-01 | Discharge: 2018-11-01 | Disposition: A | Discharge status: Home / Self Care | Payer: Medicare HMO | Source: Ambulatory Visit | Attending: Physician Assistant | Admitting: Physician Assistant

## 2018-11-01 ENCOUNTER — Encounter: Payer: Self-pay | Admitting: Radiology

## 2018-11-01 DIAGNOSIS — R920 Mammographic microcalcification found on diagnostic imaging of breast: Secondary | ICD-10-CM

## 2018-11-01 DIAGNOSIS — E2839 Other primary ovarian failure: Secondary | ICD-10-CM

## 2021-07-26 ENCOUNTER — Telehealth (HOSPITAL_COMMUNITY): Payer: Self-pay | Admitting: *Deleted

## 2021-07-26 NOTE — Telephone Encounter (Signed)
Received via fax referral for this pt to participate in cardiac rehab s/p 7/13 PCI at [facility].  Called and spoke to Laritza regarding her interest in participating in [place].  Samyra indicated that she was not for right now. Marsella is not permitted to drive at this time due to syncopal episode.  Sande is hopeful that now that she has the stent she will be able to return to driving. Follow up with cardiology on 8/26.  Will mail brochure and she will contact us when she is ready to schedule. Alanson Aly, BSN Cardiac and Emergency planning/management officer

## 2021-08-09 ENCOUNTER — Ambulatory Visit: Payer: Medicare Other | Admitting: Pulmonary Disease

## 2021-08-09 ENCOUNTER — Encounter: Payer: Self-pay | Admitting: Pulmonary Disease

## 2021-08-09 ENCOUNTER — Other Ambulatory Visit: Payer: Self-pay

## 2021-08-09 VITALS — BP 118/74 | HR 68 | Ht 61.0 in | Wt 156.0 lb

## 2021-08-09 DIAGNOSIS — R590 Localized enlarged lymph nodes: Secondary | ICD-10-CM

## 2021-08-09 NOTE — Progress Notes (Signed)
Synopsis: Referred in August 2022 for abdnormal CT chest by Georgette Shell, PA  Subjective:   PATIENT ID: Felicia Mills GENDER: female DOB: 03/08/1951, MRN: [id number]   HPI  Chief Complaint  Patient presents with   Consult    Referred by PCP for abnormal CT scan that done back in July 2022. States she has been feeling well, denies any breathing concerns.    Felicia Mills is a 69 year old woman, never smoker with diabetes mellitus type II and hypertension who is referred to pulmonary clinic for abnormal CT chest.   She had a syncopal episode recently and underwent cardiac workup with CT cardiac scan on 06/16/21 which revealed prominent to mildly enlarged mediastinal and hilar lymph nodes (1.2cm low right paratracheal node and a 1.5cm subcarinal node) along with peripheral areas of groundglass opacity and interstitial opacities. A dedicated CT Chest with contrast was then performed on 06/24/21 which noted peripheral and perihilar interstitial thickening in the right and left upper lungs. No ground glass opacities. Stable bilateral hilar and mediastinal adenopathy.   The coronary CT scan did show severe calcified probable obstructive coronary artery disease in the LAD.  She underwent cardiac cath on 06/29/2021 which did identify a 95% heavily calcific focal stenosis of the LAD and had a eluding stent placed in the mid LAD.  I do not have the discs for these chest imaging scans for personal review.  The patient is a non-smoker and denies any history of secondhand smoke exposure.  She denies any history of harmful dust or chemical exposures.  She denies any cough, wheezing, shortness of breath, fever, chills, lack of appetite or weight loss.  No family history of cancer or sarcoidosis.  Past Medical History:  Diagnosis Date   Diabetes mellitus without complication (HCC)      No family history on file.   Social History   Socioeconomic History   Marital status: Married    Spouse name: Not  on file   Number of children: Not on file   Years of education: Not on file   Highest education level: Not on file  Occupational History   Not on file  Tobacco Use   Smoking status: Never   Smokeless tobacco: Never  Substance and Sexual Activity   Alcohol use: No    Alcohol/week: 0.0 standard drinks   Drug use: No   Sexual activity: Not on file  Other Topics Concern   Not on file  Social History Narrative   Not on file   Social Determinants of Health   Financial Resource Strain: Not on file  Food Insecurity: Not on file  Transportation Needs: Not on file  Physical Activity: Not on file  Stress: Not on file  Social Connections: Not on file  Intimate Partner Violence: Not on file     Allergies  Allergen Reactions   Vicodin [Hydrocodone-Acetaminophen] Nausea And Vomiting     Outpatient Medications Prior to Visit  Medication Sig Dispense Refill   aspirin 81 MG EC tablet Take 81 mg by mouth.     atorvastatin (LIPITOR) 80 MG tablet Take 80 mg by mouth at bedtime.     BRILINTA 90 MG TABS tablet Take 90 mg by mouth 2 (two) times daily.     Calcium-Vitamins C & D (CALCIUM/C/D PO) Take by mouth.     Insulin Glargine-Lixisenatide (SOLIQUA) 100-33 UNT-MCG/ML SOPN 18 Units.     magnesium oxide (MAG-OX) 400 MG tablet Take by mouth.     metFORMIN (  GLUCOPHAGE) 1000 MG tablet Take by mouth.     metoprolol succinate (TOPROL-XL) 25 MG 24 hr tablet Take 25 mg by mouth daily.     Multiple Vitamin (MULTIVITAMIN) tablet Take 1 tablet by mouth every morning.     naproxen (NAPROSYN) 500 MG tablet Take 500 mg by mouth 2 (two) times daily.     nitroGLYCERIN (NITROSTAT) 0.4 MG SL tablet Place under the tongue.     aspirin 325 MG tablet Take 325 mg by mouth daily.     ciprofloxacin (CIPRO) 500 MG tablet Take 1 tablet (500 mg total) by mouth 2 (two) times daily. 12 tablet 0   glipiZIDE (GLUCOTROL XL) 10 MG 24 hr tablet Take 10 mg by mouth daily.     lisinopril-hydrochlorothiazide  (PRINZIDE,ZESTORETIC) 10-12.5 MG tablet Take 1 tablet by mouth daily.     loperamide (IMODIUM) 2 MG capsule Take 1 capsule (2 mg total) by mouth as needed for diarrhea or loose stools. 30 capsule 0   metFORMIN (GLUCOPHAGE) 1000 MG tablet Take 1,000 mg by mouth 2 (two) times daily.     Potassium 75 MG TABS Take 75 mg by mouth daily.     saccharomyces boulardii (FLORASTOR) 250 MG capsule Take 1 capsule (250 mg total) by mouth 2 (two) times daily. 20 capsule 0   simvastatin (ZOCOR) 40 MG tablet Take 40 mg by mouth at bedtime.     zolpidem (AMBIEN) 10 MG tablet Take 10 mg by mouth at bedtime as needed.      No facility-administered medications prior to visit.    Review of Systems  Constitutional:  Negative for chills, fever, malaise/fatigue and weight loss.  HENT:  Negative for congestion, sinus pain and sore throat.   Eyes: Negative.   Respiratory:  Negative for cough, hemoptysis, sputum production, shortness of breath and wheezing.   Cardiovascular:  Negative for chest pain, palpitations, orthopnea, claudication and leg swelling.  Gastrointestinal:  Negative for abdominal pain, heartburn, nausea and vomiting.  Genitourinary: Negative.   Musculoskeletal:  Negative for joint pain and myalgias.  Skin:  Negative for rash.  Neurological:  Negative for weakness.  Endo/Heme/Allergies: Negative.   Psychiatric/Behavioral: Negative.       Objective:   Vitals:   08/09/21 1532  BP: 118/74  Pulse: 68  SpO2: 97%  Weight: 70.8 kg  Height: 5\' 1"  (1.549 m)     Physical Exam Constitutional:      General: She is not in acute distress.    Appearance: She is not ill-appearing.  HENT:     Head: Normocephalic and atraumatic.  Eyes:     General: No scleral icterus.    Conjunctiva/sclera: Conjunctivae normal.     Pupils: Pupils are equal, round, and reactive to light.  Cardiovascular:     Rate and Rhythm: Normal rate and regular rhythm.     Pulses: Normal pulses.     Heart sounds: Normal  heart sounds. No murmur heard. Pulmonary:     Effort: Pulmonary effort is normal.     Breath sounds: Normal breath sounds. No wheezing, rhonchi or rales.  Abdominal:     General: Bowel sounds are normal.     Palpations: Abdomen is soft.  Musculoskeletal:     Right lower leg: No edema.     Left lower leg: No edema.  Lymphadenopathy:     Cervical: No cervical adenopathy.  Skin:    General: Skin is warm and dry.  Neurological:     General: No focal deficit present.  Mental Status: She is alert.  Psychiatric:        Mood and Affect: Mood normal.        Behavior: Behavior normal.        Thought Content: Thought content normal.        Judgment: Judgment normal.   CBC    Component Value Date/Time   WBC 6.9 02/05/2016 0351   RBC 3.92 02/05/2016 0351   HGB 11.7 (L) 02/05/2016 0351   HCT 34.3 (L) 02/05/2016 0351   PLT 172 02/05/2016 0351   MCV 87.5 02/05/2016 0351   MCH 29.8 02/05/2016 0351   MCHC 34.1 02/05/2016 0351   RDW 14.1 02/05/2016 0351   LYMPHSABS 0.9 02/01/2016 2033   MONOABS 0.8 02/01/2016 2033   EOSABS 0.2 02/01/2016 2033   BASOSABS 0.0 02/01/2016 2033   Chest imaging: As detailed in the HPI  PFT: No flowsheet data found.  Heart Catheterization 06/29/21: Single Vessel LAD disease, DES placed  Assessment & Plan:   Mediastinal adenopathy - Plan: CT OUTSIDE FILMS CHEST  Discussion: Felicia Mills is a 70 year old woman, never smoker with diabetes mellitus type II and hypertension who is referred to pulmonary clinic for abnormal CT chest.   She has bilateral hilar and mediastinal adenopathy along with perihilar interstitial opacities noted on CT chest imaging.  She is otherwise asymptomatic at this time.  We discussed the differential of hilar adenopathy with the primary concern of malignancy versus sarcoidosis versus reactive adenopathy to infection or due to her acute cardiac issues.  We will obtain the imaging from [facility] for personal review and at that  time decide whether to proceed with bronchoscopy for lymph node sampling or we monitor her adenopathy with a follow-up CT scan with contrast in January 2023.  We will schedule follow up based on the CT scan review.  Melody Comas, MD [facility] Office: [phone]   Current Outpatient Medications:    aspirin 81 MG EC tablet, Take 81 mg by mouth., Disp: , Rfl:    atorvastatin (LIPITOR) 80 MG tablet, Take 80 mg by mouth at bedtime., Disp: , Rfl:    BRILINTA 90 MG TABS tablet, Take 90 mg by mouth 2 (two) times daily., Disp: , Rfl:    Calcium-Vitamins C & D (CALCIUM/C/D PO), Take by mouth., Disp: , Rfl:    Insulin Glargine-Lixisenatide (SOLIQUA) 100-33 UNT-MCG/ML SOPN, 18 Units., Disp: , Rfl:    magnesium oxide (MAG-OX) 400 MG tablet, Take by mouth., Disp: , Rfl:    metFORMIN (GLUCOPHAGE) 1000 MG tablet, Take by mouth., Disp: , Rfl:    metoprolol succinate (TOPROL-XL) 25 MG 24 hr tablet, Take 25 mg by mouth daily., Disp: , Rfl:    Multiple Vitamin (MULTIVITAMIN) tablet, Take 1 tablet by mouth every morning., Disp: , Rfl:    naproxen (NAPROSYN) 500 MG tablet, Take 500 mg by mouth 2 (two) times daily., Disp: , Rfl:    nitroGLYCERIN (NITROSTAT) 0.4 MG SL tablet, Place under the tongue., Disp: , Rfl:

## 2021-08-09 NOTE — Patient Instructions (Signed)
We will obtain the CT images from [facility] for further review and be in touch with you once we do about the next steps in moving forward with bronchoscopy or monitoring the lesions with repeat CT chest scan in 6 months.

## 2021-08-10 ENCOUNTER — Encounter: Payer: Self-pay | Admitting: Pulmonary Disease

## 2021-08-10 NOTE — Addendum Note (Signed)
Addended by: Melody Comas on: 08/10/2021 12:27 PM   Modules accepted: Orders

## 2021-08-10 NOTE — Addendum Note (Signed)
Addended by: Melody Comas on: 08/10/2021 12:23 PM   Modules accepted: Orders

## 2021-08-12 ENCOUNTER — Telehealth: Payer: Self-pay | Admitting: Pulmonary Disease

## 2021-08-12 DIAGNOSIS — R590 Localized enlarged lymph nodes: Secondary | ICD-10-CM

## 2021-08-12 NOTE — Telephone Encounter (Signed)
I spoke with the patient about her CT scans that I was able to personally review.   We will plan to do a CT PET scan for the mediastinal adenopathy in early October as we will need to wait at least 3 months from her coronary stent that was recently placed in July before any potential procedure could be performed.   Please place order for CPET in early October.  Thanks, Cletis Athens

## 2021-08-12 NOTE — Telephone Encounter (Signed)
Order for PET scan has been placed.

## 2021-08-31 ENCOUNTER — Telehealth: Payer: Self-pay | Admitting: Pulmonary Disease

## 2021-08-31 ENCOUNTER — Ambulatory Visit
Admission: RE | Admit: 2021-08-31 | Discharge: 2021-08-31 | Disposition: A | Discharge status: Home / Self Care | Payer: Self-pay | Source: Ambulatory Visit | Attending: Pulmonary Disease | Admitting: Pulmonary Disease

## 2021-08-31 DIAGNOSIS — R590 Localized enlarged lymph nodes: Secondary | ICD-10-CM

## 2021-08-31 NOTE — Telephone Encounter (Signed)
Disk has been sent to [facility].

## 2021-08-31 NOTE — Telephone Encounter (Signed)
Yes, I was already able to view her scans from [facility] from the digital transfer I was able to receive already. The disc can be sent to [facility].  Thanks, Cletis Athens

## 2021-08-31 NOTE — Telephone Encounter (Signed)
We received a disk from [facility] in [place] that contains the images from the CT Chest W/Contrast that was performed on 06/24/21.   JD, is this the same CT scan you mentioned being able to view in the note from 08/12/21? Will send to [facility] to be uploaded after you have viewed it.

## 2021-09-20 ENCOUNTER — Other Ambulatory Visit: Payer: Self-pay

## 2021-09-20 ENCOUNTER — Ambulatory Visit (HOSPITAL_COMMUNITY)
Admission: RE | Admit: 2021-09-20 | Discharge: 2021-09-20 | Disposition: A | Discharge status: Home / Self Care | Payer: Medicare Other | Source: Ambulatory Visit | Attending: Pulmonary Disease | Admitting: Pulmonary Disease

## 2021-09-20 DIAGNOSIS — I2510 Atherosclerotic heart disease of native coronary artery without angina pectoris: Secondary | ICD-10-CM | POA: Insufficient documentation

## 2021-09-20 DIAGNOSIS — I700 Atherosclerosis of aorta: Secondary | ICD-10-CM | POA: Insufficient documentation

## 2021-09-20 DIAGNOSIS — R590 Localized enlarged lymph nodes: Secondary | ICD-10-CM | POA: Diagnosis present

## 2021-09-20 DIAGNOSIS — J984 Other disorders of lung: Secondary | ICD-10-CM | POA: Diagnosis not present

## 2021-09-20 LAB — GLUCOSE, CAPILLARY: Glucose-Capillary: 127 mg/dL — ABNORMAL HIGH (ref 70–99)

## 2021-09-20 MED ORDER — FLUDEOXYGLUCOSE F - 18 (FDG) INJECTION
8.4000 | Freq: Once | INTRAVENOUS | Status: AC | PRN
  Administered 2021-09-20: 7.79 via INTRAVENOUS

## 2021-09-22 ENCOUNTER — Telehealth: Payer: Self-pay | Admitting: Pulmonary Disease

## 2021-09-22 DIAGNOSIS — R590 Localized enlarged lymph nodes: Secondary | ICD-10-CM

## 2021-09-22 NOTE — Telephone Encounter (Signed)
Order has been placed.

## 2021-09-22 NOTE — Telephone Encounter (Signed)
Please place in an order so we can schedule.   Thanks!

## 2021-09-22 NOTE — Telephone Encounter (Signed)
Working on order

## 2021-09-22 NOTE — Telephone Encounter (Signed)
Please schedule the following:  Provider performing procedure:Nefertari Rebman Diagnosis: Mediastinal Adenopathy, PET Avid Which side if for nodule / mass? N/a Procedure: Flexible Bronchoscopy, EBUS and TBNA  Has patient been spoken to by Provider and given informed consent? Yes Anesthesia: Yes, general Do you need Fluro? no Duration of procedure: 60 to 90 minutes Date: 10/04/11 Alternate Date: n/a  Time: anytime in the afternoon Location: [facility], Endo Suite Does patient have OSA? no DM? no Or Latex allergy? no Medication Restriction: None Anticoagulate/Antiplatelet: Brilinta - will need to be held starting 09/30/21 (discussed with patient already) Pre-op Labs Ordered:determined by Anesthesia Imaging request: n/a (If, SuperDimension CT Chest, please have STAT courier sent to ENDO)  Please coordinate Pre-op COVID Testing

## 2021-09-22 NOTE — Telephone Encounter (Signed)
The following has been scheduled & pt has been made aware:  [facility] -    10/17 @ 12 - pt to check in by 8:30 - d/t need to have covid test same day of procedure d/t transportation issues.   No CT requested.  Pt to stop Brilinta on 10/14.

## 2021-09-29 ENCOUNTER — Other Ambulatory Visit: Payer: Self-pay

## 2021-09-29 ENCOUNTER — Encounter (HOSPITAL_COMMUNITY): Payer: Self-pay | Admitting: Pulmonary Disease

## 2021-09-29 NOTE — Progress Notes (Signed)
Anesthesia Chart Review:  Follows with cardiology at [facility] for CAD s/p recent DES to LAD  06/29/21 (prior syncope 04/02/21), recent 5 beat run of NSVT by cardiac monitor. Last seen by Dr. Houston Siren 08/12/21. Per note, "Felicia Mills is a 70 y.o. female with past medical history as above presented for follow-up for severe CAD, recent PCI of LAD, syncope, 5 beat run of NSVT by cardiac monitor. -Severe CAD, status post PCI of her LAD last month- July of this year. Patient is doing well. She denies chest pain, shortness of breath, palpitations. Patient can walk 1 to 2 miles without problem. Patient is compliant to medications. -Syncope in April of this year- vasovagal syncope versus ischemia driven arrhythmias or known ischemia driven arrhythmias are on the differential. Orthostatics were checked during recent visit in April of this year-patient was not orthostatic by either systolic or diastolic BP or heart rate.  Cardiac echo with normal LV systolic function and no significant valvular disease as above. Recent EEG was noted normal. 30-day cardiac monitor from from June of this year revealed 5 beat run of NSVT with heart rate 187 bpm. Patient denies palpitations. Patient denies any more episodes of syncope. Patient would like to restart driving. Considering recent PCI of severe LAD disease, I suggested to repeat 30-day monitor and if cardiac monitor is unremarkable patient could restart driving. If cardiac monitor reveals small arrhythmias I will refer patient for EP evaluation. Heart rate is 62 today. Continue low-dose metoprolol.I strongly encouraged patient to drink plenty of water to avoid dehydration and volume depletion. See CAD above."  Per note from Dr. Francine Graven 09/22/21, patient instructed to hold Brilinta starting 09/30/21. PAT RN Modesto Charon reached out to Dr. Francine Graven to confirm that cardiology was okay with holding antiplatelet. She received staff message response from Dr. Francine Graven confirming that he  did reach out to cardiology and they cleared the pt to hold Brilinta for 3d once 3 months out from PCI.  DMII, last A1c 7.6 on 06/30/21.  Pt will need DOS labs and eval.   EKG 08/12/21 (care everywhere, narrative only): Sinus rhythm. Rate 62.  CT Chest 06/24/21: IMPRESSION:  1.  Bilateral interstitial thickening is also unchanged. Findings again may represent sequela of previous infectious/inflammatory process with parenchymal scarring.   2.  Stable mediastinal and bilateral hilar adenopathy, indeterminate. Neoplastic or lymphoproliferative process is not excluded.    Cath and PCI 06/29/21 (care everywhere): CONCLUSIONS:  1. Syncope  2. Abnormal coronary artery CT with severe coronary calcification and  potential obstructive stenosis within the LAD and distal right coronary  artery.  3. Single-vessel angiographically obstructive coronary artery disease of  the LAD with heavily calcified complex eccentric ulcerated stenosis in the  mid LAD  4. Successful rotational atherectomy balloon angioplasty and stenting of  mid LAD  5. Diabetes mellitus  6. Dyslipidemia  7. Hypertension   RECOMMENDATIONS:  1. Dual antiplatelet therapy for at least 6 months  2. Aggressive risk factor modification and goal-directed medical therapy    TTE 05/06/21 (care everywhere): Mitral Valve: There is moderate posterior annular calcification.    Aortic Valve: The aortic valve is tricuspid. The leaflets are mildly  thickened and exhibit normal excursion. The leaflets are mildly calcified.    Mitral Valve: Mitral valve structure is normal. The leaflets are mildly  thickened and exhibit normal excursion.    Left Ventricle: Wall motion is normal.    Left Ventricle: Systolic function is normal. EF: 65%.    Left Atrium:  Left atrium is borderline dilated.    Left Ventricle: Doppler parameters are indeterminate for diastolic  function.    Tricuspid Valve: The right ventricular systolic pressure is normal (<36   mmHg).    Left Ventricle: There is mild concentric hypertrophy.   Zannie Cove [facility] Short Stay Center/Anesthesiology Phone [phone] 09/30/2021 8:51 AM

## 2021-09-29 NOTE — Progress Notes (Addendum)
Felicia Mills had a cardiac cath with stent placement on 06/29/21 at [facility], Cardiologist is Dr Tamsen Meek.  Felicia Mills is on Brilinta, Dr. Francine Graven instructed patient to hold Brilinta starting 09/30/21. Felicia Mills reported that she was not given instructions regarding ASA.  I spoke with Felicia Mills- PA-C for anesthesiology regarding this, I need to know if the cardiologist gave his ok with holding Brilinta. I sent Dr. Francine Graven a staff message asking if he spoke with patient's cardiologist.   Felicia. Mahr has type II diabetes, patient reports that fasting CBG's have been around 120. I instructed patient to not take anything for diabetes Monday am.  Felicia.Amadi had reported to me that  Patient's endocrinologists is Dr. Judithann Sheen.  Last A1C was 7.6 on 7. I instructed patient to check CBG after awaking and every 2 hours until arrival  to the hospital.  I Instructed patient if CBG is less than 70 to take 4 Glucose Tablets or 1 tube of Glucose Gel or 1/2 cup of a clear juice. Recheck CBG in 15 minutes if CBG is not over 70 call, pre- op desk at [phone] for further instructions.   I instructed patient to shower with antibiotic soap, if it is available.  Dry off with a clean towel. Do not put lotion, powder, cologne or deodorant or makeup.No jewelry or piercings. Men may shave their face and neck. Woman should not shave. No nail polish, artificial or acrylic nails. Wear clean clothes, brush your teeth. Glasses, contact lens,dentures or partials may not be worn in the OR. If you need to wear them, please bring a case for glasses, do not wear contacts or bring a case, the hospital does not have contact cases, dentures or partials will have to be removed , make sure they are clean, we will provide a denture cup to put them in. You will need some one to drive you home and a responsible person over the age of 33 to stay with you for the first 24 hours after surgery.   Felicia Poles, PA-C will be reviewing.  1800   I received a staff message from Dr. Francine Graven saying that he spoke to the cardiologist who said it is safe to hold Brilinta  for 3 days ,  Last DOse should be 09/29/21.

## 2021-10-03 ENCOUNTER — Other Ambulatory Visit: Payer: Self-pay

## 2021-10-03 ENCOUNTER — Ambulatory Visit (HOSPITAL_COMMUNITY): Payer: Medicare Other | Admitting: Physician Assistant

## 2021-10-03 ENCOUNTER — Encounter (HOSPITAL_COMMUNITY): Payer: Self-pay | Admitting: Pulmonary Disease

## 2021-10-03 ENCOUNTER — Ambulatory Visit (HOSPITAL_COMMUNITY)
Admission: RE | Admit: 2021-10-03 | Discharge: 2021-10-03 | Disposition: A | Discharge status: Home / Self Care | Payer: Medicare Other | Attending: Pulmonary Disease | Admitting: Pulmonary Disease

## 2021-10-03 ENCOUNTER — Ambulatory Visit (HOSPITAL_COMMUNITY): Payer: Medicare Other

## 2021-10-03 ENCOUNTER — Encounter (HOSPITAL_COMMUNITY)
Admission: RE | Disposition: A | Discharge status: Home / Self Care | Payer: Self-pay | Source: Home / Self Care | Attending: Pulmonary Disease

## 2021-10-03 DIAGNOSIS — Z20822 Contact with and (suspected) exposure to covid-19: Secondary | ICD-10-CM | POA: Diagnosis not present

## 2021-10-03 DIAGNOSIS — I2510 Atherosclerotic heart disease of native coronary artery without angina pectoris: Secondary | ICD-10-CM | POA: Diagnosis not present

## 2021-10-03 DIAGNOSIS — I10 Essential (primary) hypertension: Secondary | ICD-10-CM | POA: Insufficient documentation

## 2021-10-03 DIAGNOSIS — Z887 Allergy status to serum and vaccine status: Secondary | ICD-10-CM | POA: Diagnosis not present

## 2021-10-03 DIAGNOSIS — E119 Type 2 diabetes mellitus without complications: Secondary | ICD-10-CM | POA: Diagnosis not present

## 2021-10-03 DIAGNOSIS — Z794 Long term (current) use of insulin: Secondary | ICD-10-CM | POA: Insufficient documentation

## 2021-10-03 DIAGNOSIS — Z791 Long term (current) use of non-steroidal anti-inflammatories (NSAID): Secondary | ICD-10-CM | POA: Diagnosis not present

## 2021-10-03 DIAGNOSIS — I509 Heart failure, unspecified: Secondary | ICD-10-CM

## 2021-10-03 DIAGNOSIS — Z79899 Other long term (current) drug therapy: Secondary | ICD-10-CM | POA: Insufficient documentation

## 2021-10-03 DIAGNOSIS — Z955 Presence of coronary angioplasty implant and graft: Secondary | ICD-10-CM | POA: Diagnosis not present

## 2021-10-03 DIAGNOSIS — Z7984 Long term (current) use of oral hypoglycemic drugs: Secondary | ICD-10-CM | POA: Insufficient documentation

## 2021-10-03 DIAGNOSIS — R590 Localized enlarged lymph nodes: Secondary | ICD-10-CM | POA: Diagnosis present

## 2021-10-03 DIAGNOSIS — Z885 Allergy status to narcotic agent status: Secondary | ICD-10-CM | POA: Insufficient documentation

## 2021-10-03 DIAGNOSIS — Z7982 Long term (current) use of aspirin: Secondary | ICD-10-CM | POA: Insufficient documentation

## 2021-10-03 HISTORY — DX: Atherosclerotic heart disease of native coronary artery without angina pectoris: I25.10

## 2021-10-03 HISTORY — DX: Polyneuropathy, unspecified: G62.9

## 2021-10-03 HISTORY — DX: Other complications of anesthesia, initial encounter: T88.59XA

## 2021-10-03 HISTORY — PX: FINE NEEDLE ASPIRATION: SHX5430

## 2021-10-03 HISTORY — DX: Syncope and collapse: R55

## 2021-10-03 HISTORY — PX: VIDEO BRONCHOSCOPY WITH ENDOBRONCHIAL ULTRASOUND: SHX6177

## 2021-10-03 HISTORY — DX: Family history of other specified conditions: Z84.89

## 2021-10-03 LAB — GLUCOSE, CAPILLARY
Glucose-Capillary: 146 mg/dL — ABNORMAL HIGH (ref 70–99)
Glucose-Capillary: 119 mg/dL — ABNORMAL HIGH (ref 70–99)
Glucose-Capillary: 103 mg/dL — ABNORMAL HIGH (ref 70–99)
Glucose-Capillary: 104 mg/dL — ABNORMAL HIGH (ref 70–99)

## 2021-10-03 LAB — CBC
HCT: 37 % (ref 36.0–46.0)
Hemoglobin: 12.3 g/dL (ref 12.0–15.0)
MCH: 29.6 pg (ref 26.0–34.0)
MCHC: 33.2 g/dL (ref 30.0–36.0)
MCV: 88.9 fL (ref 80.0–100.0)
Platelets: 229 10*3/uL (ref 150–400)
RBC: 4.16 MIL/uL (ref 3.87–5.11)
RDW: 13.3 % (ref 11.5–15.5)
WBC: 6.7 10*3/uL (ref 4.0–10.5)
nRBC: 0 % (ref 0.0–0.2)

## 2021-10-03 LAB — BASIC METABOLIC PANEL
Anion gap: 11 (ref 5–15)
BUN: 18 mg/dL (ref 8–23)
CO2: 22 mmol/L (ref 22–32)
Calcium: 8.9 mg/dL (ref 8.9–10.3)
Chloride: 106 mmol/L (ref 98–111)
Creatinine, Ser: 0.87 mg/dL (ref 0.44–1.00)
GFR, Estimated: >60 mL/min (ref >60)
Glucose, Bld: 158 mg/dL — ABNORMAL HIGH (ref 70–99)
Potassium: 4.1 mmol/L (ref 3.5–5.1)
Sodium: 139 mmol/L (ref 135–145)

## 2021-10-03 LAB — SARS CORONAVIRUS 2 BY RT PCR (HOSPITAL ORDER, PERFORMED IN ~~LOC~~ HOSPITAL LAB): SARS Coronavirus 2: NEGATIVE

## 2021-10-03 SURGERY — BRONCHOSCOPY, WITH EBUS
Anesthesia: General

## 2021-10-03 MED ORDER — LACTATED RINGERS IV SOLN: INTRAVENOUS | Status: DC

## 2021-10-03 MED ORDER — DEXAMETHASONE SODIUM PHOSPHATE 10 MG/ML IJ SOLN
INTRAMUSCULAR | Status: DC | PRN
  Administered 2021-10-03: 5 mg via INTRAVENOUS

## 2021-10-03 MED ORDER — SUGAMMADEX SODIUM 200 MG/2ML IV SOLN
INTRAVENOUS | Status: DC | PRN
  Administered 2021-10-03: 200 mg via INTRAVENOUS

## 2021-10-03 MED ORDER — PROPOFOL 10 MG/ML IV BOLUS
INTRAVENOUS | Status: DC | PRN
  Administered 2021-10-03: 130 mg via INTRAVENOUS

## 2021-10-03 MED ORDER — CHLORHEXIDINE GLUCONATE 0.12 % MT SOLN
OROMUCOSAL | Status: AC
  Administered 2021-10-03: 15 mL
  Filled 2021-10-03: qty 15

## 2021-10-03 MED ORDER — FENTANYL CITRATE (PF) 100 MCG/2ML IJ SOLN: 25.0000 ug | INTRAMUSCULAR | Status: DC | PRN

## 2021-10-03 MED ORDER — LIDOCAINE 2% (20 MG/ML) 5 ML SYRINGE
INTRAMUSCULAR | Status: DC | PRN
Start: 2021-10-03 — End: 2021-10-03
  Administered 2021-10-03: 100 mg via INTRAVENOUS

## 2021-10-03 MED ORDER — ONDANSETRON HCL 4 MG/2ML IJ SOLN
INTRAMUSCULAR | Status: DC | PRN
  Administered 2021-10-03: 4 mg via INTRAVENOUS

## 2021-10-03 MED ORDER — PHENYLEPHRINE HCL-NACL 20-0.9 MG/250ML-% IV SOLN
INTRAVENOUS | Status: DC | PRN
  Administered 2021-10-03: 20 ug/min via INTRAVENOUS

## 2021-10-03 MED ORDER — ROCURONIUM BROMIDE 10 MG/ML (PF) SYRINGE
PREFILLED_SYRINGE | INTRAVENOUS | Status: DC | PRN
  Administered 2021-10-03: 10 mg via INTRAVENOUS
  Administered 2021-10-03: 60 mg via INTRAVENOUS

## 2021-10-03 MED ORDER — FENTANYL CITRATE (PF) 100 MCG/2ML IJ SOLN
INTRAMUSCULAR | Status: DC | PRN
  Administered 2021-10-03: 100 ug via INTRAVENOUS

## 2021-10-03 NOTE — Discharge Instructions (Signed)
Please call our office if you notice a fever 24 hours after bronchoscopy  Please call our office if you notice are coughing up blood that is worsening. Is expected to cough up some blood after the procedure but the blood should darken over time with decreased amounts.   We will call you with the results later this week once available from the pathology department.

## 2021-10-03 NOTE — Anesthesia Postprocedure Evaluation (Signed)
Anesthesia Post Note  Patient: Felicia Mills  Procedure(s) Performed: VIDEO BRONCHOSCOPY WITH ENDOBRONCHIAL ULTRASOUND FINE NEEDLE ASPIRATION (FNA) LINEAR     Patient location during evaluation: PACU Anesthesia Type: General Level of consciousness: awake Pain management: pain level controlled Vital Signs Assessment: post-procedure vital signs reviewed and stable Respiratory status: spontaneous breathing, nonlabored ventilation, respiratory function stable and patient connected to nasal cannula oxygen Cardiovascular status: blood pressure returned to baseline and stable Postop Assessment: no apparent nausea or vomiting Anesthetic complications: no   No notable events documented.  Last Vitals:  Vitals:   10/03/21 1840 10/03/21 1845  BP: (!) 107/52 114/76  Pulse: 62 63  Resp: 13 12  Temp:  36.6 C  SpO2: 92% 95%    Last Pain:  Vitals:   10/03/21 1845  TempSrc:   PainSc: 0-No pain                 Kriston Pasquarello P Elek Holderness

## 2021-10-03 NOTE — Transfer of Care (Signed)
Immediate Anesthesia Transfer of Care Note  Patient: Felicia Mills  Procedure(s) Performed: VIDEO BRONCHOSCOPY WITH ENDOBRONCHIAL ULTRASOUND FINE NEEDLE ASPIRATION (FNA) LINEAR  Patient Location: PACU  Anesthesia Type:General  Level of Consciousness: drowsy  Airway & Oxygen Therapy: Patient Spontanous Breathing and Patient connected to face mask oxygen  Post-op Assessment: Report given to RN and Post -op Vital signs reviewed and stable  Post vital signs: Reviewed and stable  Last Vitals:  Vitals Value Taken Time  BP 135/61 10/03/21 1640  Temp    Pulse 70 10/03/21 1645  Resp 19 10/03/21 1645  SpO2 97 % 10/03/21 1645  Vitals shown include unvalidated device data.  Last Pain:  Vitals:   10/03/21 1014  TempSrc:   PainSc: 0-No pain         Complications: No notable events documented.

## 2021-10-03 NOTE — Anesthesia Preprocedure Evaluation (Addendum)
Anesthesia Evaluation  Patient identified by MRN, date of birth, ID band Patient awake    Reviewed: Allergy & Precautions, NPO status , Patient's Chart, lab work & pertinent test results, reviewed documented beta blocker date and time   History of Anesthesia Complications Negative for: history of anesthetic complications  Airway Mallampati: II  TM Distance: >3 FB Neck ROM: Full    Dental  (+) Missing,    Pulmonary neg pulmonary ROS,    Pulmonary exam normal        Cardiovascular Pt. on medications and Pt. on home beta blockers + CAD and + Cardiac Stents (06/29/21 to LAD)  Normal cardiovascular exam  TTE 05/06/21: EF 65%, mild LVH   Neuro/Psych negative neurological ROS  negative psych ROS   GI/Hepatic negative GI ROS, Neg liver ROS,   Endo/Other  diabetes, Type 2, Oral Hypoglycemic Agents, Insulin Dependent  Renal/GU negative Renal ROS  negative genitourinary   Musculoskeletal negative musculoskeletal ROS (+)   Abdominal   Peds  Hematology negative hematology ROS (+)   Anesthesia Other Findings Day of surgery medications reviewed with patient.  Reproductive/Obstetrics negative OB ROS                            Anesthesia Physical Anesthesia Plan  ASA: 3  Anesthesia Plan: General   Post-op Pain Management:    Induction: Intravenous  PONV Risk Score and Plan: 3 and Treatment may vary due to age or medical condition, Ondansetron, Dexamethasone and Midazolam  Airway Management Planned: Oral ETT  Additional Equipment: None  Intra-op Plan:   Post-operative Plan: Extubation in OR  Informed Consent: I have reviewed the patients History and Physical, chart, labs and discussed the procedure including the risks, benefits and alternatives for the proposed anesthesia with the patient or authorized representative who has indicated his/her understanding and acceptance.     Dental advisory  given  Plan Discussed with: CRNA  Anesthesia Plan Comments:        Anesthesia Quick Evaluation

## 2021-10-03 NOTE — Op Note (Signed)
Flexible and EBUS Bronchoscopy Procedure Note  Felicia Mills  [id number]  [birth year]  Date:10/03/21  Time:4:31 PM   Provider Performing:Malek Skog B Donavyn Fecher   Procedure: Flexible bronchoscopy and EBUS Bronchoscopy  Indication(s) Mediastinal Adenopathy  Consent Risks of the procedure as well as the alternatives and risks of each were explained to the patient and/or caregiver.  Consent for the procedure was obtained.  Anesthesia General Anesthesia   Time Out Verified patient identification, verified procedure, site/side was marked, verified correct patient position, special equipment/implants available, medications/allergies/relevant history reviewed, required imaging and test results available.   Sterile Technique Usual hand hygiene, masks, gowns, and gloves were used   Procedure Description Diagnostic bronchoscope advanced through endotracheal tube and into airway.  Airways were examined down to subsegmental level with findings noted below.  Following diagnostic evaluation, the diagnostic bronchoscope was then removed and the EBUS bronchoscope was advanced into airway with stations 7 and 4R biopsied and sent for slide, cell block, and/or culture.  Multiple passes made at statin 7, only 1 pass at station 4R. Difficulty obtaining safe biopsy angle on 11R and 4R so minimal to no passes performed. The EBUS bronchoscope was removed after assuring no active bleeding from biopsy site.  Findings:  - General airway inspection was unremarkable.  - Mediastinal adenopathy most prominent at station 7    Complications/Tolerance None; patient tolerated the procedure well. Chest X-ray is not needed post procedure.   EBL Minimal   Specimen(s) Station 7 LN sent for cytology and cell block

## 2021-10-03 NOTE — H&P (Signed)
NAME:  Felicia Mills, MRN:  [id number], DOB:  December 22, 1950, LOS: 0 ADMISSION DATE:  10/03/2021, CONSULTATION DATE:  10/03/21 REFERRING MD:  n/a CHIEF COMPLAINT:  Mediastinal adenopathy   History of Present Illness:  Felicia Mills is a 70 year old woman, never smoker with diabetes mellitus type II and hypertension who is referred to pulmonary clinic for abnormal CT chest.    She had a syncopal episode recently and underwent cardiac workup with CT cardiac scan on 06/16/21 which revealed prominent to mildly enlarged mediastinal and hilar lymph nodes (1.2cm low right paratracheal node and a 1.5cm subcarinal node) along with peripheral areas of groundglass opacity and interstitial opacities. A dedicated CT Chest with contrast was then performed on 06/24/21 which noted peripheral and perihilar interstitial thickening in the right and left upper lungs. No ground glass opacities. Stable bilateral hilar and mediastinal adenopathy.    The coronary CT scan did show severe calcified probable obstructive coronary artery disease in the LAD.  She underwent cardiac cath on 06/29/2021 which did identify a 95% heavily calcific focal stenosis of the LAD and had a eluding stent placed in the mid LAD.   I do not have the discs for these chest imaging scans for personal review.  The patient is a non-smoker and denies any history of secondhand smoke exposure.  She denies any history of harmful dust or chemical exposures.  She denies any cough, wheezing, shortness of breath, fever, chills, lack of appetite or weight loss.   No family history of cancer or sarcoidosis.  Recent PET scan showed increased uptake of the mediastinal adenopathy.  Pertinent  Medical History  DMII CAD s/p PCI and stent   Significant Hospital Events: Including procedures, antibiotic start and stop dates in addition to other pertinent events     Interim History / Subjective:   No significant changes in health since last visit 08/09/21. She has  been off her plavix for 5 days.   Objective   Blood pressure (!) 129/52, pulse 62, temperature 98.3 F (36.8 C), temperature source Oral, resp. rate 17, height 5\' 1"  (1.549 m), weight 68.9 kg, SpO2 98 %.       No intake or output data in the 24 hours ending 10/03/21 1205 Filed Weights   09/29/21 1534 10/03/21 0943  Weight: 68.9 kg 68.9 kg    Examination: General: no acute distress HENT: [place]/AT, sclera anicteric, moist mucous membranes Lungs: clear to auscultation bilaterally Cardiovascular: rrr, no murmurs Abdomen: soft, non-tender, non-distended Extremities: warm, no edema Neuro: alert, oriented x 3, moving all extremities GU: deferred  Assessment & Plan:  Felicia Mills is a 70 year old woman, never smoker with diabetes mellitus type II and hypertension who is referred to pulmonary clinic for abnormal CT chest.    She has bilateral hilar and mediastinal adenopathy along with perihilar interstitial opacities noted on CT chest imaging.  CT PET scan shows increased uptake of the mediastinal adenopathy for which we are planning EBUS and TB NA biopsies of these lymph nodes today.    Labs   CBC: No results for input(s): WBC, NEUTROABS, HGB, HCT, MCV, PLT in the last 168 hours.  Basic Metabolic Panel: No results for input(s): NA, K, CL, CO2, GLUCOSE, BUN, CREATININE, CALCIUM, MG, PHOS in the last 168 hours. GFR: CrCl cannot be calculated (Patient's most recent lab result is older than the maximum 21 days allowed.). No results for input(s): PROCALCITON, WBC, LATICACIDVEN in the last 168 hours.  Liver Function Tests: No  results for input(s): AST, ALT, ALKPHOS, BILITOT, PROT, ALBUMIN in the last 168 hours. No results for input(s): LIPASE, AMYLASE in the last 168 hours. No results for input(s): AMMONIA in the last 168 hours.  ABG No results found for: PHART, PCO2ART, PO2ART, HCO3, TCO2, ACIDBASEDEF, O2SAT   Coagulation Profile: No results for input(s): INR, PROTIME in the last 168  hours.  Cardiac Enzymes: No results for input(s): CKTOTAL, CKMB, CKMBINDEX, TROPONINI in the last 168 hours.  HbA1C: No results found for: HGBA1C  CBG: Recent Labs  Lab 10/03/21 0944 10/03/21 1201  GLUCAP 146* 119*    Review of Systems:   ROS   Past Medical History:  She,  has a past medical history of Complication of anesthesia, Coronary artery disease, Diabetes mellitus without complication (HCC), Family history of adverse reaction to anesthesia, Neuropathy, and Syncope.   Surgical History:   Past Surgical History:  Procedure Laterality Date   CARDIAC CATHETERIZATION     CATARACT EXTRACTION Bilateral    2019. 2020   COLONOSCOPY     HYSTERECTOMY ABDOMINAL WITH SALPINGECTOMY  1998   SHOULDER ARTHROSCOPY W/ ROTATOR CUFF REPAIR Left 2010   bone spurs     Social History:   reports that she has never smoked. She has never used smokeless tobacco. She reports that she does not drink alcohol and does not use drugs.   Family History:  Her family history is not on file.   Allergies Allergies  Allergen Reactions   Pneumococcal Vaccine    Vicodin [Hydrocodone-Acetaminophen] Nausea And Vomiting     Home Medications  Prior to Admission medications   Medication Sig Start Date End Date Taking? Authorizing Provider  aspirin 81 MG EC tablet Take 81 mg by mouth daily. 06/30/21  Yes [provider]  atorvastatin (LIPITOR) 80 MG tablet Take 80 mg by mouth at bedtime. 07/26/21  Yes [provider]  BRILINTA 90 MG TABS tablet Take 90 mg by mouth 2 (two) times daily. 07/26/21  Yes [provider]  CALCIUM-VITAMIN D PO Take 1 tablet by mouth daily.   Yes [provider]  Insulin Glargine-Lixisenatide (SOLIQUA) 100-33 UNT-MCG/ML SOPN Inject 20 Units into the skin every morning. 06/02/21  Yes [provider]  levocetirizine (XYZAL) 5 MG tablet Take 5 mg by mouth every evening.   Yes [provider]  lisinopril-hydrochlorothiazide  (ZESTORETIC) 10-12.5 MG tablet Take 1 tablet by mouth daily.   Yes [provider]  MAGNESIUM OXIDE PO Take 200 mg by mouth daily. 06/09/21  Yes [provider]  metFORMIN (GLUCOPHAGE) 1000 MG tablet Take 1,000 mg by mouth 2 (two) times daily with a meal. 03/01/21  Yes [provider]  metoprolol succinate (TOPROL-XL) 25 MG 24 hr tablet Take 25 mg by mouth daily. 06/09/21  Yes [provider]  Multiple Vitamin (MULTIVITAMIN) tablet Take 1 tablet by mouth every morning.   Yes [provider]  nitroGLYCERIN (NITROSTAT) 0.4 MG SL tablet Place 0.4 mg under the tongue every 5 (five) minutes as needed for chest pain. 06/30/21  Yes [provider]  naproxen (NAPROSYN) 500 MG tablet Take 500 mg by mouth 2 (two) times daily as needed for moderate pain. 04/05/21   [provider]     Critical care time: n/a    Melody Comas, MD [facility] Office: [phone]   See Amion for personal pager PCCM on call pager [phone] until 7pm. Please call Elink 7p-7a. [phone]

## 2021-10-03 NOTE — Anesthesia Procedure Notes (Signed)
Procedure Name: Intubation Date/Time: 10/03/2021 2:52 PM Performed by: Reece Agar, CRNA Pre-anesthesia Checklist: Patient identified, Emergency Drugs available, Suction available and Patient being monitored Patient Re-evaluated:Patient Re-evaluated prior to induction Oxygen Delivery Method: Circle System Utilized Preoxygenation: Pre-oxygenation with 100% oxygen Induction Type: IV induction Ventilation: Mask ventilation without difficulty Laryngoscope Size: Mac and 3 Grade View: Grade I Tube type: Oral Number of attempts: 1 Airway Equipment and Method: Stylet Placement Confirmation: ETT inserted through vocal cords under direct vision, positive ETCO2 and breath sounds checked- equal and bilateral Secured at: 20 cm Tube secured with: Tape Dental Injury: Teeth and Oropharynx as per pre-operative assessment

## 2021-10-04 LAB — CYTOLOGY - NON PAP

## 2021-10-05 ENCOUNTER — Encounter (HOSPITAL_COMMUNITY): Payer: Self-pay | Admitting: Pulmonary Disease

## 2022-05-23 ENCOUNTER — Telehealth: Payer: Self-pay | Admitting: Pulmonary Disease

## 2022-05-24 NOTE — Telephone Encounter (Signed)
Called and left patient a voicemail to call office back in regards to her questions about office visit.

## 2022-05-26 NOTE — Telephone Encounter (Signed)
Called and spoke with patient. Patient stated that she couldn't remember if she had the bronchoscopy done. Advised patient that she did. Patient wants to know if it looked okay.   JD, please advise.

## 2022-05-30 NOTE — Telephone Encounter (Signed)
Covering for Dr Francine Graven.  It sounds like she had a bronchoscopy with lymph node biopsy last year. The biopsy was negative for cancer. I think if she has any further questions or concerns she probably needs an appointment with Dr. Francine Graven at this point.

## 2022-05-30 NOTE — Telephone Encounter (Signed)
Called and spoke with pt letting her know the info per Dr. Celine Mans as she is covering for JD and she verbalized understanding. Nothing further needed.

## 2022-06-05 NOTE — Telephone Encounter (Signed)
Called patient but she did not answer. Left message for her to call us back.  

## 2022-06-05 NOTE — Telephone Encounter (Signed)
Patient should have follow up scheduled with me.   There are previous messages where I requested she follow up with [place] to monitor the mediastinal lymphadenopathy but they were unable to reach her in the past to schedule follow up.   We will likely need to schedule her for follow up CT Chest scan to monitor the lymphadenopathy noted from last year.  Thanks, JD

## 2022-07-14 IMAGING — PT NM PET TUM IMG INITIAL (PI) SKULL BASE T - THIGH
1 series · 10 of 10 positions shown · non-contrast
Comparison: Outside CT chest dated 06/24/2021

CLINICAL DATA: Initial treatment strategy for mediastinal mass.

EXAM:
NUCLEAR MEDICINE PET SKULL BASE TO THIGH
TECHNIQUE: 7.8 mCi F-18 FDG was injected intravenously. Full-ring PET imaging
was performed from the skull base to thigh after the radiotracer. CT
data was obtained and used for attenuation correction and anatomic
localization.
Fasting blood glucose: 127 mg/dl

[Series 1083: results mm oncology reading · 1.5mm · 0.62mm/px · 10 of 10 slices shown]
[im 1/10]
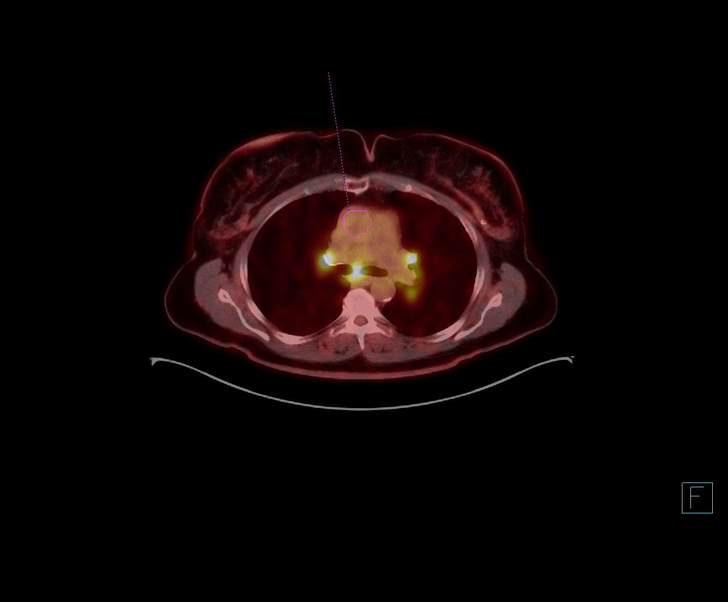
[im 2/10]
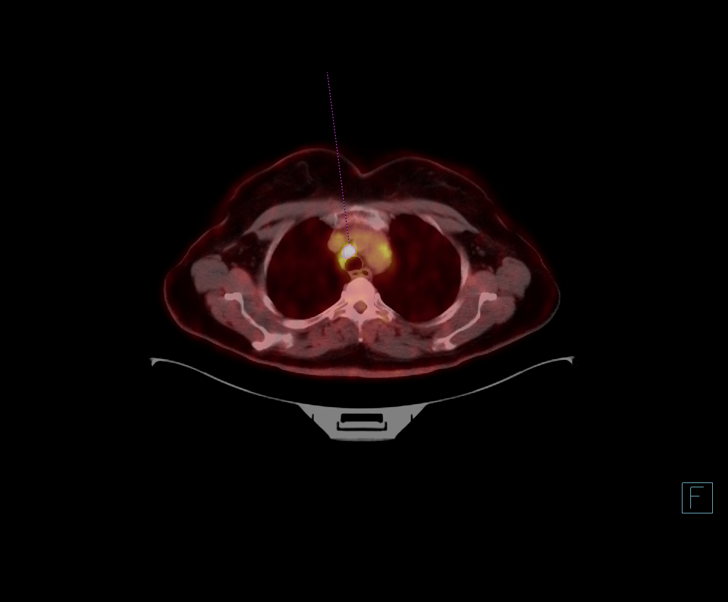
[im 3/10]
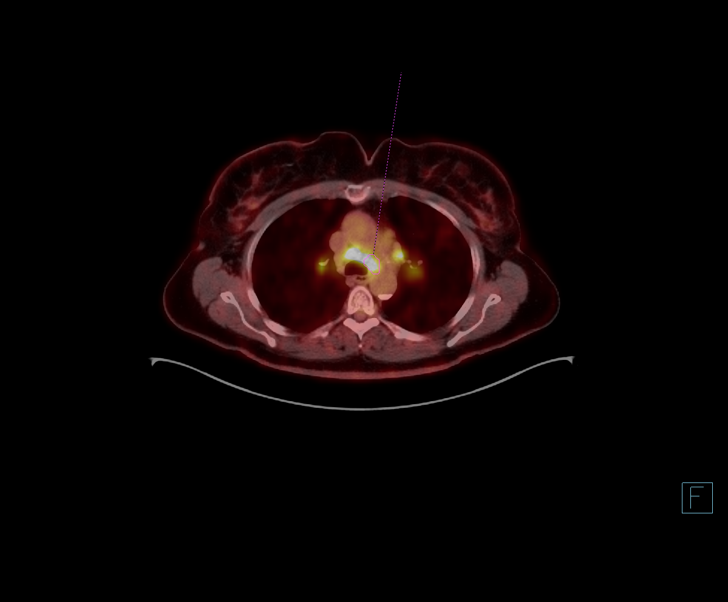
[im 4/10]
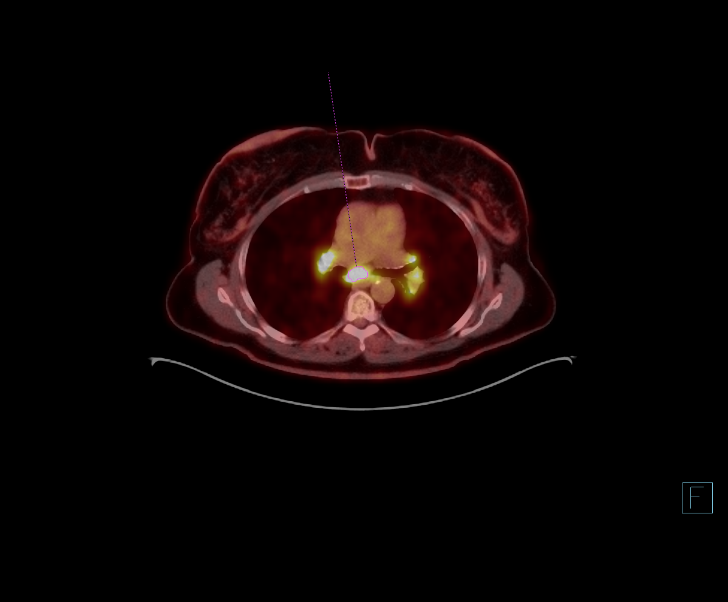
[im 5/10]
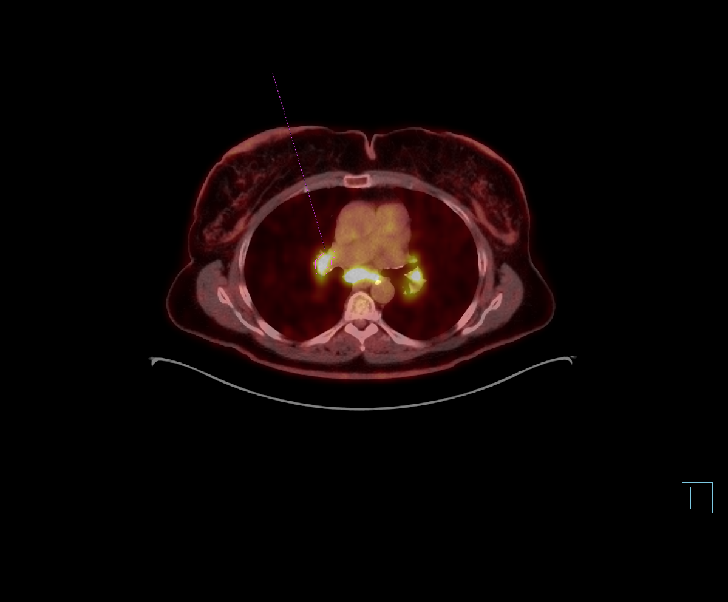
[im 6/10]
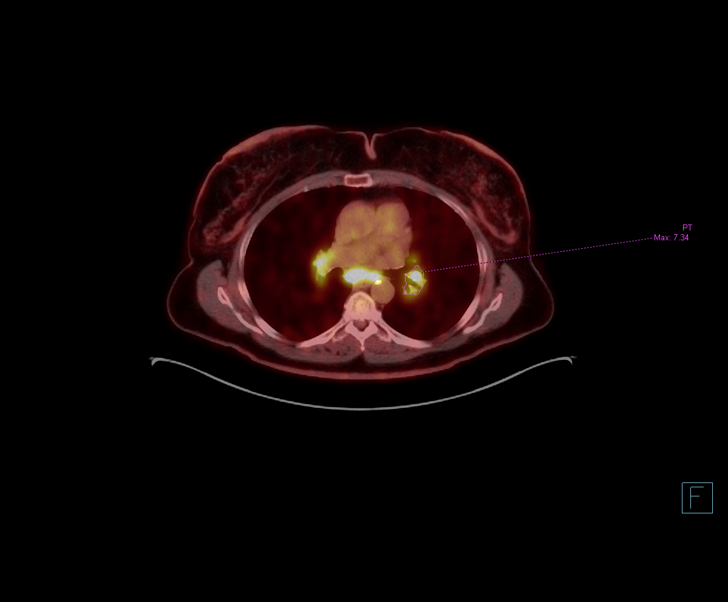
[im 7/10]
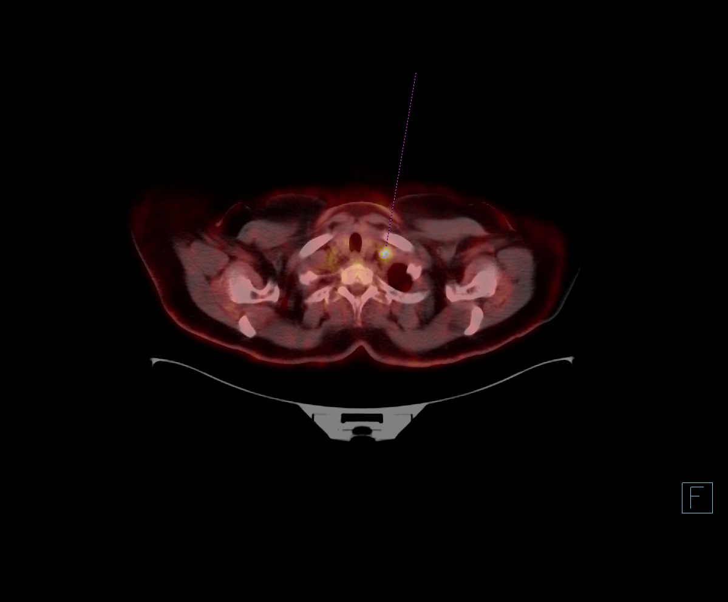
[im 8/10]
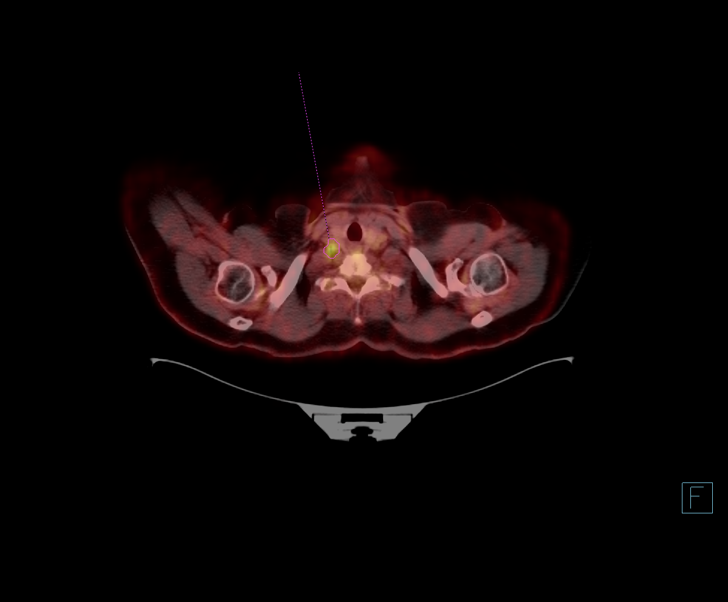
[im 9/10]
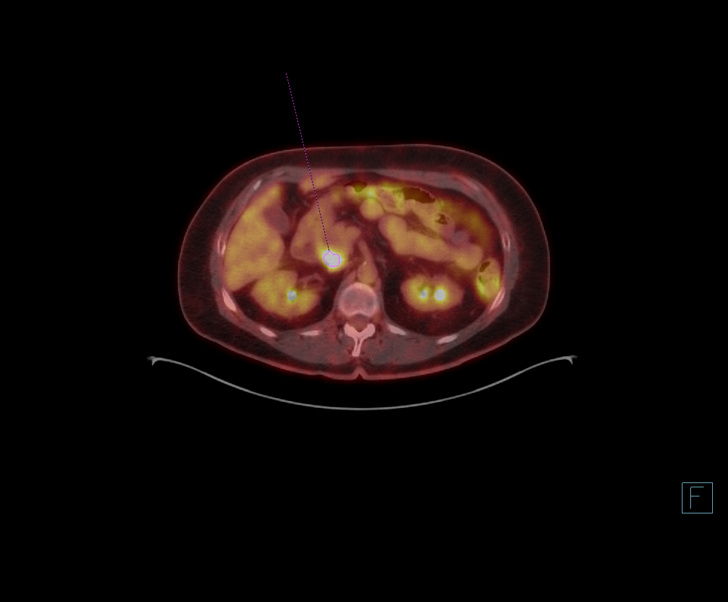
[im 10/10]
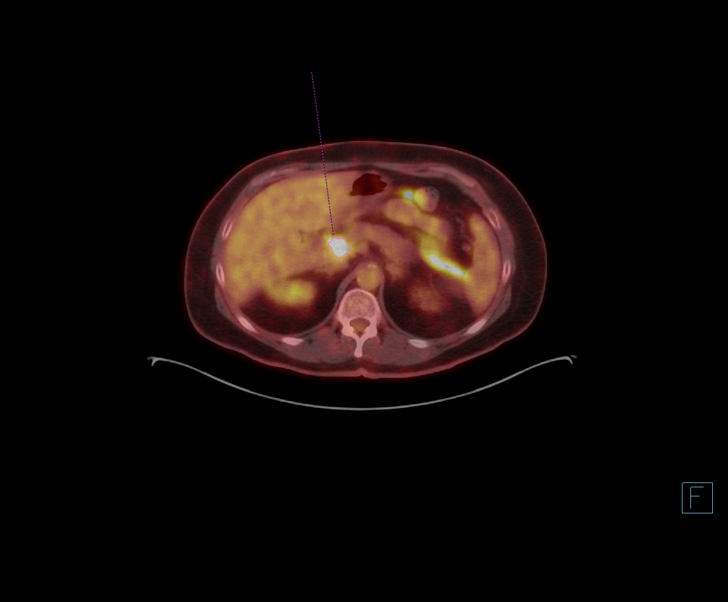

[10 of 10 positions shown; findings below may reference images not displayed]

FINDINGS: Mediastinal blood pool activity: SUV max

Liver activity: SUV max NA

NECK: No hypermetabolic lymph nodes in the neck.

Incidental CT findings: none

CHEST: FDG avid bilateral mediastinal and bilateral hilar lymph
nodes are identified. This includes:

-high right paratracheal lymph node measures 1 cm with SUV max of
9.3, image 50/4.

Left paratracheal lymph node measures 0.7 cm with SUV max of 7.51,
image 58/4. Subcarinal lymph node measures 1.3 cm with SUV max of
11.0

Right hilar lymph node has an SUV max of 9.08, image 63/4.

Left hilar lymph nodes have an SUV max of 7.34, image 63/4.

Left supraclavicular lymph node measures 0.6 cm and has an SUV max
of 6.78, image 39/4.

Tiny right supraclavicular lymph node measures 0.6 cm with SUV max
of 4.4, image 36/4. No FDG avid axillary lymph nodes.

Incidental CT findings: Bilateral upper and lower lung zone areas of
scarring with architectural distortion noted. No FDG avid pulmonary
nodules.Aortic atherosclerosis and coronary artery calcifications.

ABDOMEN/PELVIS: FDG avid porta hepatic and portacaval lymph nodes
are identified.

Portacaval lymph node measures 1.2 cm within SUV max of 14.19, image
99/4. FDG avid porta hepatic lymph node measures 1.1 cm with SUV max
of 8.89, image 96/4.

No abnormal FDG uptake within the liver, pancreas, adrenal glands or
spleen.

Incidental CT findings: Aortic atherosclerosis.

SKELETON: No focal hypermetabolic activity to suggest skeletal
metastasis.

Incidental CT findings: none
IMPRESSION: 1. Examination is positive for FDG avid the bilateral mediastinal,
bilateral hilar, bilateral supraclavicular, and upper abdominal
lymph nodes. Differential considerations include metastatic
adenopathy, lymphoproliferative disorder, or granulomatous
inflammation/infection. Correlation with tissue sampling is advised.
2. No FDG avid solid organ lesions identified. No signs of FDG avid
osseous lesions.
3. Aortic atherosclerosis with coronary artery calcifications.
4. Bilateral upper and lower lung zone areas of scarring and
architectural distortion.

## 2023-11-06 ENCOUNTER — Telehealth: Payer: Self-pay

## 2023-11-06 NOTE — Telephone Encounter (Signed)
Shonpryl, Will you PLEASE call this patient and find out if she is on any blood thinners like Brilinta or Plavix? if she is will you get her rescheduled for an OV? if not, just let me know Thank you Bre

## 2023-11-07 NOTE — Telephone Encounter (Signed)
PT is still on Plavix. She has cancelled the procedure and PV until she seen the oncologist on Friday

## 2023-11-07 NOTE — Telephone Encounter (Signed)
Thank you :)

## 2023-12-05 ENCOUNTER — Encounter: Payer: Medicare Other | Admitting: Gastroenterology

## 2023-12-19 HISTORY — PX: LYMPH NODE BIOPSY: SHX201

## 2023-12-19 HISTORY — PX: BRONCHOSCOPY: SUR163

## 2023-12-19 HISTORY — PX: LUNG BIOPSY: SHX232

## 2024-09-17 NOTE — Progress Notes (Incomplete)
 New Patient Pulmonology Office Visit   Subjective:  Patient ID: Felicia Mills, female    DOB: [birth year]  MRN: [id number]  Referred by: Sophronia Ozell BROCKS, MD  CC: No chief complaint on file.   HPI Felicia Mills is a 73 y.o. female with ***   {PULM QUESTIONNAIRES (Optional):33196}  ROS  Allergies: Pneumococcal vaccine and Vicodin [hydrocodone-acetaminophen ]  Current Outpatient Medications:    aspirin  81 MG EC tablet, Take 81 mg by mouth daily., Disp: , Rfl:    atorvastatin (LIPITOR) 80 MG tablet, Take 80 mg by mouth at bedtime., Disp: , Rfl:    BRILINTA 90 MG TABS tablet, Take 90 mg by mouth 2 (two) times daily., Disp: , Rfl:    CALCIUM-VITAMIN D PO, Take 1 tablet by mouth daily., Disp: , Rfl:    Insulin  Glargine-Lixisenatide (SOLIQUA) 100-33 UNT-MCG/ML SOPN, Inject 20 Units into the skin every morning., Disp: , Rfl:    levocetirizine (XYZAL) 5 MG tablet, Take 5 mg by mouth every evening., Disp: , Rfl:    lisinopril-hydrochlorothiazide (ZESTORETIC) 10-12.5 MG tablet, Take 1 tablet by mouth daily., Disp: , Rfl:    MAGNESIUM OXIDE PO, Take 200 mg by mouth daily., Disp: , Rfl:    metFORMIN  (GLUCOPHAGE ) 1000 MG tablet, Take 1,000 mg by mouth 2 (two) times daily with a meal., Disp: , Rfl:    metoprolol succinate (TOPROL-XL) 25 MG 24 hr tablet, Take 25 mg by mouth daily., Disp: , Rfl:    Multiple Vitamin (MULTIVITAMIN) tablet, Take 1 tablet by mouth every morning., Disp: , Rfl:    naproxen (NAPROSYN) 500 MG tablet, Take 500 mg by mouth 2 (two) times daily as needed for moderate pain., Disp: , Rfl:    nitroGLYCERIN (NITROSTAT) 0.4 MG SL tablet, Place 0.4 mg under the tongue every 5 (five) minutes as needed for chest pain., Disp: , Rfl:  Past Medical History:  Diagnosis Date   Complication of anesthesia    Aspirated during child birth. SLow to awaken   Coronary artery disease    Diabetes mellitus without complication (HCC)    type II   Family history of adverse reaction to  anesthesia    slow to awaken   Neuropathy    Syncope    Past Surgical History:  Procedure Laterality Date   CARDIAC CATHETERIZATION     CATARACT EXTRACTION Bilateral    2019. 2020   COLONOSCOPY     FINE NEEDLE ASPIRATION  10/03/2021   Procedure: FINE NEEDLE ASPIRATION (FNA) LINEAR;  Surgeon: Kara Dorn NOVAK, MD;  Location: [facility];  Service: Pulmonary;;   HYSTERECTOMY ABDOMINAL WITH SALPINGECTOMY  1998   SHOULDER ARTHROSCOPY W/ ROTATOR CUFF REPAIR Left 2010   bone spurs   VIDEO BRONCHOSCOPY WITH ENDOBRONCHIAL ULTRASOUND N/A 10/03/2021   Procedure: VIDEO BRONCHOSCOPY WITH ENDOBRONCHIAL ULTRASOUND;  Surgeon: Kara Dorn NOVAK, MD;  Location: [facility] ENDOSCOPY;  Service: Pulmonary;  Laterality: N/A;   No family history on file. Social History   Socioeconomic History   Marital status: Married    Spouse name: Not on file   Number of children: Not on file   Years of education: Not on file   Highest education level: Not on file  Occupational History   Not on file  Tobacco Use   Smoking status: Never   Smokeless tobacco: Never  Vaping Use   Vaping status: Never Used  Substance and Sexual Activity   Alcohol use: No    Alcohol/week: 0.0 standard drinks of alcohol   Drug  use: Never   Sexual activity: Not on file  Other Topics Concern   Not on file  Social History Narrative   Not on file   Social Drivers of Health   Financial Resource Strain: Low Risk  (08/31/2024)   Received [facility]   Overall Financial Resource Strain (CARDIA)    How hard is it for you to pay for the very basics like food, housing, medical care, and heating?: Not hard at all  Food Insecurity: No Food Insecurity (08/31/2024)   Received from [facility]   Hunger Vital Sign    Within the past 12 months, you worried that your food would run out before you got the money to buy more.: Never true    Within the past 12 months, the food you bought just didn't last and you didn't have money to get  more.: Never true  Transportation Needs: No Transportation Needs (08/31/2024)   Received from [facility] - Transportation    In the past 12 months, has lack of transportation kept you from medical appointments or from getting medications?: No    In the past 12 months, has lack of transportation kept you from meetings, work, or from getting things needed for daily living?: No  Physical Activity: Insufficiently Active (08/31/2024)   Received from [facility]   Exercise Vital Sign    On average, how many days per week do you engage in moderate to strenuous exercise (like a brisk walk)?: 2 days    On average, how many minutes do you engage in exercise at this level?: 10 min  Stress: No Stress Concern Present (08/31/2024)   Received from [facility] - Occupational Stress Questionnaire    Do you feel stress - tense, restless, nervous, or anxious, or unable to sleep at night because your mind is troubled all the time - these days?: Not at all  Social Connections: Socially Integrated (08/31/2024)   Received from [facility]   Social Network    How would you rate your social network (family, work, friends)?: Good participation with social networks  Intimate Partner Violence: Not At Risk (08/31/2024)   Received from [facility]   HITS    Over the last 12 months how often did your partner physically hurt you?: Never    Over the last 12 months how often did your partner insult you or talk down to you?: Never    Over the last 12 months how often did your partner threaten you with physical harm?: Never    Over the last 12 months how often did your partner scream or curse at you?: Never       Objective:  There were no vitals taken for this visit. {Pulm Vitals (Optional):32837}  Physical Exam  Diagnostic Review:  {Labs (Optional):32838}  09/2021 FINAL MICROSCOPIC DIAGNOSIS:  A. LYMPH NODE, STATION 7, FINE NEEDLE ASPIRATION: - No  malignant cells identified  B. LYMPH NODE, 4R, FINE NEEDLE ASPIRATION: - No malignant cells identified   Alyce Ozell Chancy MD ENDO PROCEDURE ORDERABLES Final Result  Spirometry (PFT) (12/18/2023 8:07 AM EST) Component Value Ref Range Test Method Analysis Time Performed At Pathologist Signature  FEV1 1.95 liters      Comment: 101.5%  FVC 2.28 liters      Comment: 92.8%  FEV1/FVC 86 %      Comment: 108.8%  TLC        DLCO 13.61 ml/mmHg sec  Comment: 80.5%  PEAK FLOW        Comment: 102.5%  Anatomical Region Laterality Modality    Other  Impressions  12/18/2023 8:07 AM EST Spirometry is normal.  FVC and FEV1 are both within normal limits.  There is no evidence of airflow obstruction.  Diffusing capacity is normal.  Patient effort was good.      Assessment & Plan:  Earlier this year patient was found to have a large mediastinal mass Biopsy was performed by Dr. Leonce in February that was consistent with sarcoidosis and not cancer She has been diagnosed with sarcoidosis . She has not yet consulted with a pulmonologist regarding her sarcoidosis.   Assessment & Plan  Arcoidosis   No follow-ups on file.    Marny Patch, MD Pulmonary and Critical Care Medicine [facility]

## 2024-09-18 ENCOUNTER — Ambulatory Visit

## 2024-11-10 ENCOUNTER — Encounter: Payer: Self-pay | Admitting: Gastroenterology

## 2024-11-11 NOTE — Progress Notes (Signed)
 New Patient Pulmonology Office Visit   Subjective:  Patient ID: Felicia Mills, female    DOB: 28-Mar-1951  MRN: [id number]  Referred by: Sophronia Ozell BROCKS, MD  CC: No chief complaint on file.   HPI Felicia Mills is a 73 y.o. female with ***   . Multiple lung nodules (Primary)  - Multiple diffuse nodules all less than 6mm throughout both lungs with some mediastinal lymphadenopathy - Plan for bronchoscopy with EBUS for diagnostic LN sampling  - Diffusing capacity (DLCO): normal - Spirometry (PFT): normal  This was followed by an abdominal CT based on US  findings. CT demonstrated concerning findings for malignancy in the liver, spleen, and lungs. This was followed by PET scan on 11/27/23 which demonstrated PET avid areas in both lungs, liver, spleen, LN, and some bony metastases. The patient denies f/c, night sweats, pain, SOB, DOE, hemoptysis, or fatigue. She does report decreased appetite and some weight loss. She is her husband's  01/28/24 thoraco  {PULM QUESTIONNAIRES (Optional):33196}  ROS  Allergies: Pneumococcal vaccine and Vicodin [hydrocodone-acetaminophen ]  Current Outpatient Medications:    aspirin  81 MG EC tablet, Take 81 mg by mouth daily., Disp: , Rfl:    atorvastatin (LIPITOR) 80 MG tablet, Take 80 mg by mouth at bedtime., Disp: , Rfl:    BRILINTA 90 MG TABS tablet, Take 90 mg by mouth 2 (two) times daily., Disp: , Rfl:    CALCIUM-VITAMIN D PO, Take 1 tablet by mouth daily., Disp: , Rfl:    Insulin  Glargine-Lixisenatide (SOLIQUA) 100-33 UNT-MCG/ML SOPN, Inject 20 Units into the skin every morning., Disp: , Rfl:    levocetirizine (XYZAL) 5 MG tablet, Take 5 mg by mouth every evening., Disp: , Rfl:    lisinopril-hydrochlorothiazide (ZESTORETIC) 10-12.5 MG tablet, Take 1 tablet by mouth daily., Disp: , Rfl:    MAGNESIUM OXIDE PO, Take 200 mg by mouth daily., Disp: , Rfl:    metFORMIN  (GLUCOPHAGE ) 1000 MG tablet, Take 1,000 mg by mouth 2 (two) times daily with a meal.,  Disp: , Rfl:    metoprolol succinate (TOPROL-XL) 25 MG 24 hr tablet, Take 25 mg by mouth daily., Disp: , Rfl:    Multiple Vitamin (MULTIVITAMIN) tablet, Take 1 tablet by mouth every morning., Disp: , Rfl:    naproxen (NAPROSYN) 500 MG tablet, Take 500 mg by mouth 2 (two) times daily as needed for moderate pain., Disp: , Rfl:    nitroGLYCERIN (NITROSTAT) 0.4 MG SL tablet, Place 0.4 mg under the tongue every 5 (five) minutes as needed for chest pain., Disp: , Rfl:  Past Medical History:  Diagnosis Date   Complication of anesthesia    Aspirated during child birth. SLow to awaken   Coronary artery disease    Diabetes mellitus without complication (HCC)    type II   Family history of adverse reaction to anesthesia    slow to awaken   Neuropathy    Syncope    Past Surgical History:  Procedure Laterality Date   CARDIAC CATHETERIZATION     CATARACT EXTRACTION Bilateral    2019. 2020   COLONOSCOPY     FINE NEEDLE ASPIRATION  10/03/2021   Procedure: FINE NEEDLE ASPIRATION (FNA) LINEAR;  Surgeon: Kara Dorn NOVAK, MD;  Location: [facility];  Service: Pulmonary;;   HYSTERECTOMY ABDOMINAL WITH SALPINGECTOMY  1998   SHOULDER ARTHROSCOPY W/ ROTATOR CUFF REPAIR Left 2010   bone spurs   VIDEO BRONCHOSCOPY WITH ENDOBRONCHIAL ULTRASOUND N/A 10/03/2021   Procedure: VIDEO BRONCHOSCOPY WITH ENDOBRONCHIAL ULTRASOUND;  Surgeon: Kara Dorn NOVAK, MD;  Location: [facility];  Service: Pulmonary;  Laterality: N/A;   No family history on file. Social History   Socioeconomic History   Marital status: Married    Spouse name: Not on file   Number of children: Not on file   Years of education: Not on file   Highest education level: Not on file  Occupational History   Not on file  Tobacco Use   Smoking status: Never   Smokeless tobacco: Never  Vaping Use   Vaping status: Never Used  Substance and Sexual Activity   Alcohol use: No    Alcohol/week: 0.0 standard drinks of alcohol   Drug use:  Never   Sexual activity: Not on file  Other Topics Concern   Not on file  Social History Narrative   Not on file   Social Drivers of Health   Financial Resource Strain: Low Risk  (08/31/2024)   Received from [facility]   Overall Financial Resource Strain (CARDIA)    How hard is it for you to pay for the very basics like food, housing, medical care, and heating?: Not hard at all  Food Insecurity: No Food Insecurity (08/31/2024)   Received from [facility]   Hunger Vital Sign    Within the past 12 months, you worried that your food would run out before you got the money to buy more.: Never true    Within the past 12 months, the food you bought just didn't last and you didn't have money to get more.: Never true  Transportation Needs: No Transportation Needs (08/31/2024)   Received from [facility] - Transportation    In the past 12 months, has lack of transportation kept you from medical appointments or from getting medications?: No    In the past 12 months, has lack of transportation kept you from meetings, work, or from getting things needed for daily living?: No  Physical Activity: Insufficiently Active (08/31/2024)   Received from [facility]   Exercise Vital Sign    On average, how many days per week do you engage in moderate to strenuous exercise (like a brisk walk)?: 2 days    On average, how many minutes do you engage in exercise at this level?: 10 min  Stress: No Stress Concern Present (08/31/2024)   Received from [facility] - Occupational Stress Questionnaire    Do you feel stress - tense, restless, nervous, or anxious, or unable to sleep at night because your mind is troubled all the time - these days?: Not at all  Social Connections: Socially Integrated (08/31/2024)   Received from [facility]   Social Network    How would you rate your social network (family, work, friends)?: Good participation with social  networks  Intimate Partner Violence: Not At Risk (08/31/2024)   Received from [facility]   HITS    Over the last 12 months how often did your partner physically hurt you?: Never    Over the last 12 months how often did your partner insult you or talk down to you?: Never    Over the last 12 months how often did your partner threaten you with physical harm?: Never    Over the last 12 months how often did your partner scream or curse at you?: Never       Objective:  There were no vitals taken for this visit. 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 AFB and fungal negative.    Assessment & Plan:   Assessment & Plan    sarcoidosis No follow-ups on file.    Marny Patch, MD Pulmonary and Critical Care Medicine [facility]

## 2024-11-12 ENCOUNTER — Ambulatory Visit

## 2024-11-12 VITALS — BP 118/74 | HR 74 | Temp 98.0°F | Resp 18 | Ht 60.0 in | Wt 143.4 lb

## 2024-11-12 DIAGNOSIS — D8689 Sarcoidosis of other sites: Secondary | ICD-10-CM

## 2024-11-12 DIAGNOSIS — D869 Sarcoidosis, unspecified: Secondary | ICD-10-CM

## 2024-11-12 NOTE — Patient Instructions (Signed)
 Dear Ms. Soulier;  Given your history of sarcoidosis. I will start an initial evaluation with -CT chest of your lungs -Pulmonary function test at the next appointment -Eye evaluation in January  -I will send a referral to Rheumatologist for evaluation as well.  I will see you around April.

## 2024-12-01 ENCOUNTER — Inpatient Hospital Stay: Admission: RE | Admit: 2024-12-01 | Discharge: 2024-12-01

## 2024-12-01 DIAGNOSIS — D869 Sarcoidosis, unspecified: Secondary | ICD-10-CM

## 2024-12-05 ENCOUNTER — Ambulatory Visit: Payer: Self-pay

## 2024-12-24 ENCOUNTER — Encounter: Payer: Self-pay | Admitting: Emergency Medicine

## 2024-12-25 ENCOUNTER — Telehealth: Payer: Self-pay | Admitting: Emergency Medicine

## 2024-12-25 ENCOUNTER — Ambulatory Visit: Admitting: Gastroenterology

## 2024-12-25 ENCOUNTER — Encounter: Payer: Self-pay | Admitting: Gastroenterology

## 2024-12-25 VITALS — BP 120/60 | HR 57 | Ht 60.0 in | Wt 143.4 lb

## 2024-12-25 DIAGNOSIS — K649 Unspecified hemorrhoids: Secondary | ICD-10-CM

## 2024-12-25 DIAGNOSIS — K625 Hemorrhage of anus and rectum: Secondary | ICD-10-CM

## 2024-12-25 DIAGNOSIS — R194 Change in bowel habit: Secondary | ICD-10-CM

## 2024-12-25 DIAGNOSIS — Z7901 Long term (current) use of anticoagulants: Secondary | ICD-10-CM

## 2024-12-25 DIAGNOSIS — R198 Other specified symptoms and signs involving the digestive system and abdomen: Secondary | ICD-10-CM

## 2024-12-25 MED ORDER — NA SULFATE-K SULFATE-MG SULF 17.5-3.13-1.6 GM/177ML PO SOLN: 1.0000 | Freq: Once | ORAL | 0 refills | Status: AC

## 2024-12-25 NOTE — Patient Instructions (Signed)
 You have been scheduled for a colonoscopy. Please follow written instructions given to you at your visit today.   If you use inhalers (even only as needed), please bring them with you on the day of your procedure.  DO NOT TAKE 7 DAYS PRIOR TO TEST- Trulicity (dulaglutide) Ozempic, Wegovy (semaglutide) Mounjaro, Zepbound (tirzepatide) Bydureon Bcise (exanatide extended release)  DO NOT TAKE 1 DAY PRIOR TO YOUR TEST Rybelsus (semaglutide) Adlyxin (lixisenatide) Victoza (liraglutide) Byetta (exanatide) ___________________________________________________________________________   We have sent the following medications to your pharmacy for you to pick up at your convenience: Suprep  _______________________________________________________  If your blood pressure at your visit was 140/90 or greater, please contact your primary care physician to follow up on this.  _______________________________________________________  If you are age 72 or older, your body mass index should be between 23-30. Your Body mass index is 28 kg/m. If this is out of the aforementioned range listed, please consider follow up with your Primary Care Provider.  If you are age 72 or younger, your body mass index should be between 19-25. Your Body mass index is 28 kg/m. If this is out of the aformentioned range listed, please consider follow up with your Primary Care Provider.   ________________________________________________________  The  GI providers would like to encourage you to use MYCHART to communicate with providers for non-urgent requests or questions.  Due to long hold times on the telephone, sending your provider a message by [facility] may be a faster and more efficient way to get a response.  Please allow 48 business hours for a response.  Please remember that this is for non-urgent requests.  _______________________________________________________  [facility] is using a team-based  approach to care.  Your team is made up of your doctor and two to three APPS. Our APPS (Nurse Practitioners and Physician Assistants) work with your physician to ensure care continuity for you. They are fully qualified to address your health concerns and develop a treatment plan. They communicate directly with your gastroenterologist to care for you. Seeing the Advanced Practice Practitioners on your physician's team can help you by facilitating care more promptly, often allowing for earlier appointments, access to diagnostic testing, procedures, and other specialty referrals.   Due to recent changes in healthcare laws, you may see the results of your imaging and laboratory studies on MyChart before your provider has had a chance to review them.  We understand that in some cases there may be results that are confusing or concerning to you. Not all laboratory results come back in the same time frame and the provider may be waiting for multiple results in order to interpret others.  Please give us  48 hours in order for your provider to thoroughly review all the results before contacting the office for clarification of your results.

## 2024-12-25 NOTE — Progress Notes (Unsigned)
 "  Felicia Mills [id number] [birth year]   Chief Complaint:  Referring Provider: Sophronia Ozell BROCKS, MD Primary GI MD: Sampson  HPI: Felicia Mills is a 74 y.o. female with past medical history of CAD, diabetes, sarcoidosis who presents today for a complaint of change in bowel habits.    Patient referred for change in bowel habits.  Stool guaiac negative 08/2024.  Per PCP note, has chronic constipation.  Referred to GI to evaluate stool consistency and evacuation difficulties.  Due for colonoscopy 12/2024.  Labs 12/22/2024: Normal CBC, CMP   Ozempic? Reason for Plavix?                                          Discussed the use of AI scribe software for clinical note transcription with the patient, who gave verbal consent to proceed.  History of Present Illness Felicia Mills is a 74 year old female with coronary artery disease status post stent placement who presents for evaluation of a change in bowel habits and difficulty passing stool.  Altered bowel habits and constipation - Change in bowel habits for the past 3-4 weeks characterized by difficulty passing stool. - Sensation of stool collecting in a pouch beside the rectum, requiring manual assistance for evacuation. - Manual assistance for evacuation has been required for many years, but the sensation of stool collecting is new. - Baseline bowel pattern is a movement every 2-3 days, which she considers normal. - No daily or regular morning bowel movements. - Occasional pain with bowel movements, particularly when constipated. - No general abdominal pain. - No unintentional weight loss.  Rectal bleeding and hemorrhoidal symptoms - Occasional bright red blood on toilet paper or in the toilet, attributed to hemorrhoids.  Medication effects and gastrointestinal symptoms - Ozempic dose increased to 1.0 mg two weeks ago. - After dose increase, developed nausea, diarrhea, and vomiting. - Prescribed Zofran  and instructed to hold  Ozempic. - Has not taken Ozempic since onset of symptoms.  Colorectal cancer screening - No family history of colon cancer or polyps. - Last colonoscopy performed in 2013. - Scheduled colonoscopy last year was canceled due to personal and family medical issues.  Cardiovascular history - Coronary artery disease status post stent placement in 2022. - Plavix started after stent placement. - No heart attack or stroke in the past year.      Previous GI Procedures/Imaging   Colonoscopy 05/03/2012 [facility], Dr. Burnette) Nonthrombosed external hemorrhoids The distal rectum and the anal verge are normal on retroflexion view The examination was otherwise normal Recall 10 years   Past Medical History:  Diagnosis Date   Complication of anesthesia    Aspirated during child birth. SLow to awaken   Coronary artery disease    Diabetes mellitus without complication (HCC)    type II   Family history of adverse reaction to anesthesia    slow to awaken   Neuropathy    Sarcoidosis    Syncope     Past Surgical History:  Procedure Laterality Date   BRONCHOSCOPY  2025   CARDIAC CATHETERIZATION     with stent placement   CATARACT EXTRACTION Bilateral    2019. 2020   COLONOSCOPY     FINE NEEDLE ASPIRATION  10/03/2021   Procedure: FINE NEEDLE ASPIRATION (FNA) LINEAR;  Surgeon: Kara Dorn NOVAK, MD;  Location: [facility];  Service: Pulmonary;;   HYSTERECTOMY  ABDOMINAL WITH SALPINGECTOMY  1998   LUNG BIOPSY  2025   LYMPH NODE BIOPSY  2025   SHOULDER ARTHROSCOPY W/ ROTATOR CUFF REPAIR Left 2010   bone spurs   VIDEO BRONCHOSCOPY WITH ENDOBRONCHIAL ULTRASOUND N/A 10/03/2021   Procedure: VIDEO BRONCHOSCOPY WITH ENDOBRONCHIAL ULTRASOUND;  Surgeon: Kara Dorn NOVAK, MD;  Location: [facility] ENDOSCOPY;  Service: Pulmonary;  Laterality: N/A;    Current Outpatient Medications  Medication Sig Dispense Refill   aspirin  81 MG EC tablet Take 81 mg by mouth daily.     atorvastatin (LIPITOR) 80 MG  tablet Take 80 mg by mouth at bedtime.     CALCIUM-VITAMIN D PO Take 1 tablet by mouth daily.     clopidogrel (PLAVIX) 75 MG tablet Take 75 mg by mouth daily.     EPINEPHrine 0.3 mg/0.3 mL IJ SOAJ injection Inject 0.3 mg into the muscle as needed.     levocetirizine (XYZAL) 5 MG tablet Take 5 mg by mouth every evening. (Patient taking differently: Take 5 mg by mouth every evening. As needed)     lisinopril-hydrochlorothiazide (ZESTORETIC) 10-12.5 MG tablet Take 1 tablet by mouth daily.     MAGNESIUM OXIDE PO Take 200 mg by mouth daily.     metoprolol succinate (TOPROL-XL) 25 MG 24 hr tablet Take 25 mg by mouth daily.     Multiple Vitamin (MULTIVITAMIN) tablet Take 1 tablet by mouth every morning.     naproxen (NAPROSYN) 500 MG tablet Take 500 mg by mouth 2 (two) times daily as needed for moderate pain.     nitroGLYCERIN (NITROSTAT) 0.4 MG SL tablet Place 0.4 mg under the tongue every 5 (five) minutes as needed for chest pain.     OZEMPIC, 1 MG/DOSE, 4 MG/3ML SOPN Inject 1 mg into the skin once a week.     TOUJEO MAX SOLOSTAR 300 UNIT/ML Solostar Pen Inject 20 Units into the skin daily.     ondansetron  (ZOFRAN ) 8 MG tablet Take 8 mg by mouth every 8 (eight) hours as needed. (Patient not taking: Reported on 12/25/2024)     No current facility-administered medications for this visit.    Allergies as of 12/25/2024 - Review Complete 12/25/2024  Allergen Reaction Noted   Pneumococcal vaccine  09/29/2021   Vicodin [hydrocodone-acetaminophen ] Nausea And Vomiting 02/01/2016    Family History  Problem Relation Age of Onset   Heart disease Mother    Hypertension Mother    Diabetes Mother    Heart disease Father    Diabetes Father    Heart disease Brother    Diabetes Brother    Heart disease Brother    Diabetes Brother    Heart disease Maternal Grandmother    Hypertension Son     Social History[1]   Review of Systems:    Constitutional: No weight loss, fever, chills, weakness or  fatigue Eyes: No change in vision Ears, Nose, Throat:  No change in hearing or congestion Skin: No rash or itching Cardiovascular: No chest pain, chest pressure or palpitations   Respiratory: No SOB or cough Gastrointestinal: See HPI and otherwise negative Genitourinary: No dysuria or change in urinary frequency Neurological: No headache, dizziness or syncope Musculoskeletal: No new muscle or joint pain Hematologic: No bleeding or bruising    Physical Exam:  Vital signs: BP 120/60 (BP Location: Left Arm, Patient Position: Sitting, Cuff Size: Normal)   Pulse (!) 57   Ht 5' (1.524 m) Comment: height measured without shoes  Wt 143 lb 6 oz (65 kg)  BMI 28.00 kg/m   Wt Readings from Last 3 Encounters:  12/25/24 143 lb 6 oz (65 kg)  11/12/24 143 lb 6.4 oz (65 kg)  10/03/21 152 lb (68.9 kg)     Constitutional: NAD, Well developed, Well nourished, alert and cooperative Head:  Normocephalic and atraumatic.  Eyes: No scleral icterus. Conjunctiva pink. Mouth: No oral lesions. Respiratory: Respirations even and unlabored. Lungs clear to auscultation bilaterally.  No wheezes, crackles, or rhonchi.  Cardiovascular:  Regular rate and rhythm. No murmurs. No peripheral edema. Gastrointestinal:  Soft, nondistended, nontender. No rebound or guarding. Normal bowel sounds. No appreciable masses or hepatomegaly. Rectal:  Not performed.  Neurologic:  Alert and oriented x4;  grossly normal neurologically.  Skin:   Dry and intact without significant lesions or rashes. Psychiatric: Oriented to person, place and time. Demonstrates good judgement and reason without abnormal affect or behaviors.      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 Structural abnormalities need exclusion due to interval since last colonoscopy. - Scheduled colonoscopy to evaluate change  in bowel habits and rule out structural abnormalities. - Held Ozempic prior to colonoscopy per protocol; provided instructions regarding timing. - Held Plavix prior to colonoscopy per protocol; will coordinate with prescribing provider for specific instructions. - Discussed pelvic floor physical therapy as a potential intervention. - Reviewed dietary fiber supplementation and Miralax as options if constipation persists.  Hemorrhoids with rectal bleeding Intermittent mild hematochezia consistent with hemorrhoidal etiology, without features suggestive of malignancy or other serious pathology. - Scheduled colonoscopy to evaluate for alternative sources of rectal bleeding and assess severity of hemorrhoids.   Schedule colonoscopy for change in bowel habits PT if everything is normal     Camie Furbish, PA-C [facility] 12/25/2024, 3:00 PM  Patient Care Team: Sophronia Ozell BROCKS, MD as PCP - General (Family Medicine)       [1]  Social History Tobacco Use   Smoking status: Never   Smokeless tobacco: Never  Vaping Use   Vaping status: Never Used  Substance Use Topics   Alcohol use: No    Alcohol/week: 0.0 standard drinks of alcohol   Drug use: Never   "

## 2024-12-25 NOTE — Telephone Encounter (Signed)
 Felicia Mills 1951/08/19 [id number]  12/25/2024   Dear Dr. Marcello Lennox ([facility]):  We have scheduled the above named patient for a(n) colonoscopy procedure. Our records show that (s)he is on anticoagulation therapy.  Please advise as to whether the patient may come off their therapy of Plavix  5 days prior to their procedure which is scheduled for 01/15/2025.  Please route your response to Lorraina Spring, CMA or fax response to [phone].  Sincerely,    [facility]

## 2024-12-28 ENCOUNTER — Encounter: Payer: Self-pay | Admitting: Gastroenterology

## 2024-12-29 NOTE — Progress Notes (Signed)
 ____________________________________________________________  Attending physician addendum:  Thank you for sending this case to me. I have reviewed the entire note and agree with the plan.  Agree, it sounds like pelvic floor dysfunction (which can be a progressive issue), and side effect of GLP agonist with a recent dose increase.  Victory Brand, MD  ____________________________________________________________

## 2025-01-02 NOTE — Telephone Encounter (Signed)
 A fax from Dr. Marcello Lennox [facility]) has been received and he has cleared the patient to proceed with her procedure on 01/15/2025. Called patient and instructions were given for her to hold Plavix for 5 days per provider. Patient expressed understanding of instructions.

## 2025-01-08 ENCOUNTER — Encounter: Payer: Self-pay | Admitting: Gastroenterology

## 2025-01-14 ENCOUNTER — Encounter: Admitting: Gastroenterology

## 2025-01-15 ENCOUNTER — Encounter: Admitting: Gastroenterology

## 2025-02-18 ENCOUNTER — Encounter: Admitting: Gastroenterology

## 2025-03-05 ENCOUNTER — Ambulatory Visit: Admitting: Internal Medicine

## 2025-04-13 ENCOUNTER — Ambulatory Visit

## 2025-04-13 ENCOUNTER — Encounter
